# Patient Record
Sex: Female | Born: 1995 | Race: Black or African American | Hispanic: No | Marital: Single | Smoking: Never smoker
Health system: Southern US, Community
[De-identification: ages and names within clinical notes are randomized; demographics above are authoritative.]

## PROBLEM LIST (undated history)

## (undated) ENCOUNTER — Inpatient Hospital Stay (HOSPITAL_COMMUNITY): Payer: Self-pay

## (undated) DIAGNOSIS — E079 Disorder of thyroid, unspecified: Secondary | ICD-10-CM

## (undated) DIAGNOSIS — B999 Unspecified infectious disease: Secondary | ICD-10-CM

## (undated) DIAGNOSIS — E0590 Thyrotoxicosis, unspecified without thyrotoxic crisis or storm: Secondary | ICD-10-CM

## (undated) HISTORY — PX: NO PAST SURGERIES: SHX2092

---
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
## 2014-12-21 ENCOUNTER — Emergency Department (HOSPITAL_COMMUNITY)
Admission: EM | Admit: 2014-12-21 | Discharge: 2014-12-21 | Disposition: A | Discharge status: Home / Self Care | Payer: Medicaid Other | Attending: Emergency Medicine | Admitting: Emergency Medicine

## 2014-12-21 ENCOUNTER — Encounter (HOSPITAL_COMMUNITY): Payer: Self-pay | Admitting: Neurology

## 2014-12-21 DIAGNOSIS — Z760 Encounter for issue of repeat prescription: Secondary | ICD-10-CM | POA: Insufficient documentation

## 2014-12-21 DIAGNOSIS — Z79899 Other long term (current) drug therapy: Secondary | ICD-10-CM | POA: Diagnosis not present

## 2014-12-21 DIAGNOSIS — Z8639 Personal history of other endocrine, nutritional and metabolic disease: Secondary | ICD-10-CM | POA: Diagnosis not present

## 2014-12-21 LAB — TSH: TSH: 0.335 u[IU]/mL — AB (ref 0.350–4.500)

## 2014-12-21 MED ORDER — PROPYLTHIOURACIL 50 MG PO TABS: 50.0000 mg | ORAL_TABLET | Freq: Three times a day (TID) | ORAL | Status: DC

## 2014-12-21 NOTE — ED Notes (Signed)
Pt has no complaints at this time. Pt states she doesn't have a MD at this time.

## 2014-12-21 NOTE — Discharge Instructions (Signed)
Please follow-up with community health and wellness to establish primary care. Take her medications as prescribed. Return to ED for reevaluation. Experience abdominal pain, nausea vomiting or any new or worsening symptoms.  Medicine Refill at the Emergency Department We have refilled your medicine today, but it is best for you to get refills through your primary health care provider's office. In the future, please plan ahead so you do not need to get refills from the emergency department. If the medicine we refilled was a maintenance medicine, you may have received only enough to get you by until you are able to see your regular health care provider.   This information is not intended to replace advice given to you by your health care provider. Make sure you discuss any questions you have with your health care provider.   Document Released: 04/30/2003 Document Revised: 02/01/2014 Document Reviewed: 04/20/2013 Elsevier Interactive Patient Education [employer] Elsevier Inc.

## 2014-12-21 NOTE — ED Notes (Signed)
Pt is in stable condition upon d/c and ambulates from ED. 

## 2014-12-21 NOTE — ED Provider Notes (Signed)
CSN: [id number]     Arrival date & time 12/21/14  1358 History  By signing my name below, I, Taylor Gonzalez, attest that this documentation has been prepared under the direction and in the presence of Mayme Genta, [employer] Electronically Signed: Soijett Gonzalez, ED Scribe. 12/21/2014. 2:36 PM.   Chief Complaint  Patient presents with  . Medication Refill      The history is provided by the patient. No language interpreter was used.    HPI Comments: Taylor Gonzalez is a 19 y.o. female with a medical hx of thyroid dx who presents to the Emergency Department complaining of medication refill onset today. She notes that she has ran out of her PTU that she uses for her hyperthyroid that she was dx with while pregnant. She denies CP, SOB, abdominal pain, sore throat, n/v, constipation, difficulty urinating,  and any other symptoms. Denies having a PCP at this time.    Past Medical History  Diagnosis Date  . Thyroid disease    Past Surgical History  Procedure Laterality Date  . No past surgeries     Family History  Problem Relation Age of Onset  . Hypertension Father   . Diabetes Maternal Grandmother   . Diabetes Maternal Grandfather   . Hypertension Maternal Grandfather   . Diabetes Paternal Grandmother   . Hypertension Paternal Grandmother    Social History  Substance Use Topics  . Smoking status: Never Smoker   . Smokeless tobacco: None  . Alcohol Use: No   OB History    Gravida Para Term Preterm AB TAB SAB Ectopic Multiple Living   Review of Systems  Respiratory: Negative for shortness of breath.   Cardiovascular: Negative for chest pain.  Gastrointestinal: Negative for nausea, vomiting, abdominal pain and constipation.  Genitourinary: Negative for difficulty urinating.  Skin: Negative for color change, rash and wound.  Neurological: Negative for headaches.      Allergies  Review of patient's allergies indicates no known allergies.  Home Medications    Prior to Admission medications   Medication Sig Start Date End Date Taking? Authorizing Provider  Prenatal Vit-Fe Fumarate-FA (PRENATAL MULTIVITAMIN) TABS tablet Take 1 tablet by mouth daily at 12 noon.    Historical Provider, MD  propylthiouracil (PTU) 50 MG tablet Take 1 tablet (50 mg total) by mouth 3 (three) times daily. 12/21/14   Luma Clopper, PA-C   BP 124/75 mmHg  Pulse 110  Temp(Src) 98.6 F (37 C) (Oral)  Resp 18  Ht  (1.575 m)  Wt 73.71 kg  BMI 29.71 kg/m2  SpO2 100%  LMP 12/08/2014 Physical Exam  Constitutional: She is oriented to person, place, and time. She appears well-developed and well-nourished. No distress.  HENT:  Head: Normocephalic and atraumatic.  Eyes: EOM are normal.  Neck: Neck supple. No thyromegaly present.  Cardiovascular: Normal rate, regular rhythm and normal heart sounds.  Exam reveals no gallop and no friction rub.   No murmur heard. No tachycardia on my exam heart rate mid 90s.  Pulmonary/Chest: Effort normal and breath sounds normal. No respiratory distress. She has no wheezes. She has no rales.  Musculoskeletal: Normal range of motion.  Lymphadenopathy:    She has no cervical adenopathy.  Neurological: She is alert and oriented to person, place, and time.  Skin: Skin is warm and dry. She is not diaphoretic.  Psychiatric: She has a normal mood and affect. Her behavior is normal.  Nursing note and vitals reviewed.   ED Course  Procedures (including critical care time) DIAGNOSTIC STUDIES: Oxygen Saturation is 100% on RA, nl by my interpretation.    COORDINATION OF CARE: 2:36 PM Discussed treatment plan with pt at bedside which includes TSH, PTU Rx refill, referral to  and wellness and pt agreed to plan.    Labs Review Labs Reviewed  TSH    Imaging Review No results found. I have personally reviewed and evaluated these lab results as part of my medical decision-making.   EKG Interpretation None     Meds  given in ED:  Medications - No data to display  Discharge Medication List as of 12/21/2014  2:40 PM     Filed Vitals:   12/21/14 1413  BP: 124/75  Pulse: 110  Temp: 98.6 F (37 C)  TempSrc: Oral  Resp: 18  Height: 5\' 2"  (1.575 m)  Weight: 73.71 kg  SpO2: 100%    MDM  Taylor Gonzalez is a 19 y.o. female presents for medication refill. Patient is on propylthiouracil, comes in for medication refill. Was placed on this medication during pregnancy for hyperthyroidism. Will provide short course refill. Drawn TSH. Given referral to the health and wellness Center so she may establish primary care and further management of her hyperthyroidism. Denies any other medical problems at this time. Given strict return precautions. Patient verbalizes understanding. Appears well and is appropriate for discharge.  Final diagnoses:  Medication refill    I personally performed the services described in this documentation, which was scribed in my presence. The recorded information has been reviewed and is accurate.    Joycie PeekBenjamin Hosanna Betley, PA-C 12/21/14 1502  Pricilla LovelessScott Goldston, MD 12/22/14 [phone]

## 2014-12-21 NOTE — ED Notes (Signed)
Pt is here requesting prescription for thyroid medication. She was told during her pregnancy several months ago she had hyperthyroid and was started on medication. She is here today for refill. Pt does not have PCP. Denies having any s/s just here for refill.

## 2016-01-26 NOTE — L&D Delivery Note (Signed)
21 y.o. G2P1001 at 6548w4d admitted for IOL for hyperthyroidism delivered a viable female infant at 0845 in cephalic, LOA position s/p Cytotec, foley bulb, and pitocin for induction. No nuchal cord. Right anterior shoulder delivered after minimal turtling due to mother delaying pushing with the contraction. 15 minute delayed cord clamping per family's request. Cord clamped x2 and cut. Placenta delivered spontaneously intact, with 3VC. Fundus firm on exam with massage and pitocin. Good hemostasis noted.  Anesthesia: Epidural Laceration: N/a Suture: N/a Good hemostasis noted. EBL: 100 cc  Mom and baby recovering in LDR.    Apgars: APGAR (1 MIN): 7   APGAR (5 MINS): 9   Weight: Pending skin to skin  Sponge and instrument count were correct x2. Placenta sent home. Girl, Breast feeding, Unsure for contraception.  [place] Shirley, DO FM Resident PGY-1 08/07/2016 9:16 AM   I confirm that I have verified the information documented in the resident's note and I was present and assisted with delivery.   Raynelle FanningJulie P. Hitesh Fouche, MD OB Fellow

## 2016-06-15 ENCOUNTER — Inpatient Hospital Stay (HOSPITAL_COMMUNITY)
Admission: AD | Admit: 2016-06-15 | Discharge: 2016-06-15 | Disposition: A | Discharge status: Home / Self Care | Payer: Medicaid Other | Source: Ambulatory Visit | Attending: Family Medicine | Admitting: Family Medicine

## 2016-06-15 ENCOUNTER — Encounter (HOSPITAL_COMMUNITY): Payer: Self-pay | Admitting: *Deleted

## 2016-06-15 DIAGNOSIS — Z9119 Patient's noncompliance with other medical treatment and regimen: Secondary | ICD-10-CM | POA: Diagnosis not present

## 2016-06-15 DIAGNOSIS — B9689 Other specified bacterial agents as the cause of diseases classified elsewhere: Secondary | ICD-10-CM

## 2016-06-15 DIAGNOSIS — E0590 Thyrotoxicosis, unspecified without thyrotoxic crisis or storm: Secondary | ICD-10-CM | POA: Diagnosis not present

## 2016-06-15 DIAGNOSIS — O99283 Endocrine, nutritional and metabolic diseases complicating pregnancy, third trimester: Secondary | ICD-10-CM | POA: Diagnosis not present

## 2016-06-15 DIAGNOSIS — Z91199 Patient's noncompliance with other medical treatment and regimen due to unspecified reason: Secondary | ICD-10-CM

## 2016-06-15 DIAGNOSIS — Z3A34 34 weeks gestation of pregnancy: Secondary | ICD-10-CM | POA: Insufficient documentation

## 2016-06-15 DIAGNOSIS — O26893 Other specified pregnancy related conditions, third trimester: Secondary | ICD-10-CM | POA: Insufficient documentation

## 2016-06-15 DIAGNOSIS — N760 Acute vaginitis: Secondary | ICD-10-CM

## 2016-06-15 DIAGNOSIS — R000 Tachycardia, unspecified: Secondary | ICD-10-CM

## 2016-06-15 DIAGNOSIS — Z79899 Other long term (current) drug therapy: Secondary | ICD-10-CM | POA: Diagnosis not present

## 2016-06-15 DIAGNOSIS — O0933 Supervision of pregnancy with insufficient antenatal care, third trimester: Secondary | ICD-10-CM | POA: Diagnosis not present

## 2016-06-15 DIAGNOSIS — R109 Unspecified abdominal pain: Secondary | ICD-10-CM | POA: Insufficient documentation

## 2016-06-15 DIAGNOSIS — O9989 Other specified diseases and conditions complicating pregnancy, childbirth and the puerperium: Secondary | ICD-10-CM

## 2016-06-15 HISTORY — DX: Thyrotoxicosis, unspecified without thyrotoxic crisis or storm: E05.90

## 2016-06-15 LAB — RAPID URINE DRUG SCREEN, HOSP PERFORMED
Amphetamines: NOT DETECTED
Barbiturates: NOT DETECTED
Benzodiazepines: NOT DETECTED
Cocaine: NOT DETECTED
OPIATES: NOT DETECTED
TETRAHYDROCANNABINOL: NOT DETECTED

## 2016-06-15 LAB — URINALYSIS, ROUTINE W REFLEX MICROSCOPIC
BILIRUBIN URINE: NEGATIVE
Glucose, UA: NEGATIVE mg/dL
Ketones, ur: NEGATIVE mg/dL
NITRITE: NEGATIVE
Protein, ur: NEGATIVE mg/dL
SPECIFIC GRAVITY, URINE: 1.002 — AB (ref 1.005–1.030)
pH: 7 (ref 5.0–8.0)

## 2016-06-15 LAB — WET PREP, GENITAL
SPERM: NONE SEEN
Trich, Wet Prep: NONE SEEN
Yeast Wet Prep HPF POC: NONE SEEN

## 2016-06-15 LAB — CBC
HCT: 30.8 % — ABNORMAL LOW (ref 36.0–46.0)
HEMOGLOBIN: 9.8 g/dL — AB (ref 12.0–15.0)
MCH: 26.3 pg (ref 26.0–34.0)
MCHC: 31.8 g/dL (ref 30.0–36.0)
MCV: 82.8 fL (ref 78.0–100.0)
Platelets: 310 10*3/uL (ref 150–400)
RBC: 3.72 MIL/uL — AB (ref 3.87–5.11)
RDW: 14.4 % (ref 11.5–15.5)
WBC: 8.2 10*3/uL (ref 4.0–10.5)

## 2016-06-15 LAB — DIFFERENTIAL
BASOS ABS: 0 10*3/uL (ref 0.0–0.1)
Basophils Relative: 0 %
EOS PCT: 1 %
Eosinophils Absolute: 0.1 10*3/uL (ref 0.0–0.7)
LYMPHS ABS: 1.8 10*3/uL (ref 0.7–4.0)
Lymphocytes Relative: 22 %
MONO ABS: 0.5 10*3/uL (ref 0.1–1.0)
Monocytes Relative: 7 %
NEUTROS ABS: 5.8 10*3/uL (ref 1.7–7.7)
NEUTROS PCT: 70 %

## 2016-06-15 LAB — OB RESULTS CONSOLE GBS: STREP GROUP B AG: POSITIVE

## 2016-06-15 LAB — HEPATITIS B SURFACE ANTIGEN: HEP B S AG: NEGATIVE

## 2016-06-15 LAB — TSH: TSH: 0.025 u[IU]/mL — ABNORMAL LOW (ref 0.350–4.500)

## 2016-06-15 LAB — T4, FREE: FREE T4: 0.74 ng/dL (ref 0.61–1.12)

## 2016-06-15 MED ORDER — NIFEDIPINE 10 MG PO CAPS
10.0000 mg | ORAL_CAPSULE | Freq: Once | ORAL | Status: AC
  Administered 2016-06-15: 10 mg via ORAL
  Filled 2016-06-15: qty 1

## 2016-06-15 MED ORDER — METRONIDAZOLE 500 MG PO TABS: 500.0000 mg | ORAL_TABLET | Freq: Two times a day (BID) | ORAL | 0 refills | Status: DC

## 2016-06-15 MED ORDER — METOPROLOL SUCCINATE ER 25 MG PO TB24: 25.0000 mg | ORAL_TABLET | Freq: Once | ORAL | Status: DC

## 2016-06-15 MED ORDER — METOPROLOL SUCCINATE ER 25 MG PO TB24: 25.0000 mg | ORAL_TABLET | Freq: Every day | ORAL | 0 refills | Status: DC

## 2016-06-15 MED ORDER — LACTATED RINGERS IV SOLN
INTRAVENOUS | Status: DC
  Administered 2016-06-15: 15:00:00 via INTRAVENOUS

## 2016-06-15 NOTE — MAU Provider Note (Signed)
Chief Complaint:  Abdominal Pain   First Provider Initiated Contact with Patient 06/15/16 1328     HPI: Taylor Gonzalez is a 21 y.o. G2P1001 at 2w2dwho presents to maternity admissions reporting low to mid abdominal pain that comes and goes.  Not severe.  Has not had much prenatal care.  Micah Flesher a couple of times to [employer] in another county.. She reports good fetal movement, denies LOF, vaginal bleeding, vaginal itching/burning, urinary symptoms, h/a, dizziness, n/v, diarrhea, constipation or fever/chills.    Noted to be tachycardic,   When questioned, states she has hyperthyroidism but has not taken her meds in a few years.  Does not see a doctor for this.  Was diagnosed last pregnancy.   Abdominal Pain  This is a new problem. The current episode started today. The onset quality is gradual. The problem occurs intermittently. The problem has been unchanged. The pain is located in the periumbilical region and suprapubic region. The pain is mild. The quality of the pain is cramping. The abdominal pain does not radiate. Pertinent negatives include no constipation, diarrhea, dysuria, fever, headaches, myalgias, nausea or vomiting. Nothing aggravates the pain. The pain is relieved by nothing. She has tried nothing for the symptoms.     RN Note: Having some stomach pain, started this morning. Comes and goes. Did go to HD a few times, hasn't been in some months   Past Medical History: Past Medical History:  Diagnosis Date  . Hyperthyroidism   . Thyroid disease     Past obstetric history: OB History  Gravida Para Term Preterm AB Living  2 1 1     1   SAB TAB Ectopic Multiple Live Births          1    # Outcome Date GA Lbr Len/2nd Weight Sex Delivery Anes PTL Lv  2 Current           1 Term 07/12/13 [redacted]w[redacted]d 28:50 / 01:12 5 lb 9 oz (2.523 kg) F Vag-Vacuum EPI  LIV      Past Surgical History: Past Surgical History:  Procedure Laterality Date  . NO PAST SURGERIES      Family  History: Family History  Problem Relation Age of Onset  . Diabetes Paternal Grandmother   . Hypertension Paternal Grandmother   . Hypertension Father   . Diabetes Maternal Grandmother   . Diabetes Maternal Grandfather   . Hypertension Maternal Grandfather     Social History: Social History  Substance Use Topics  . Smoking status: Never Smoker  . Smokeless tobacco: Never Used  . Alcohol use No    Allergies: No Known Allergies  Meds:  Prescriptions Prior to Admission  Medication Sig Dispense Refill Last Dose  . Prenatal Vit-Fe Fumarate-FA (PRENATAL MULTIVITAMIN) TABS tablet Take 1 tablet by mouth daily at 12 noon.   07/11/2013 at Unknown time  . propylthiouracil (PTU) 50 MG tablet Take 1 tablet (50 mg total) by mouth 3 (three) times daily. 30 tablet 0     I have reviewed patient's Past Medical Hx, Surgical Hx, Family Hx, Social Hx, medications and allergies.   ROS:  Review of Systems  Constitutional: Negative for fever.  Gastrointestinal: Positive for abdominal pain. Negative for constipation, diarrhea, nausea and vomiting.  Genitourinary: Negative for dysuria.  Musculoskeletal: Negative for myalgias.  Neurological: Negative for headaches.   Other systems negative  Physical Exam  Patient Vitals for the past 24 hrs:  BP Temp Temp src Pulse Resp SpO2 Weight  06/15/16 1302 125/76  99.3 F (37.4 C) Oral (!) 122 18 100 % 175 lb 8 oz (79.6 kg)   Constitutional: Well-developed, well-nourished female in no acute distress.  Cardiovascular: rapid rate and regular rhythm Respiratory: normal effort, clear to auscultation bilaterally GI: Abd soft, non-tender, gravid appropriate for gestational age.   No rebound or guarding. Fundus is at about 35cm MS: Extremities nontender, no edema, normal ROM Neurologic: Alert and oriented x 4.  GU: Neg CVAT.  PELVIC EXAM: Cervix pink, visually closed, without lesion, scant white creamy discharge, vaginal walls and external genitalia normal    Dilation: Closed Effacement (%): 80 Exam by:: Williams,CNM   FHT:  Baseline 130 , moderate variability, accelerations present, no decelerations Contractions:  Irritability with small contractions   Labs:    Results for orders placed or performed during the hospital encounter of 06/15/16 (from the past 72 hour(s))  Urinalysis, Routine w reflex microscopic     Status: Abnormal   Collection Time: 06/15/16  1:03 PM  Result Value Ref Range   Color, Urine YELLOW YELLOW   APPearance HAZY (A) CLEAR   Specific Gravity, Urine 1.002 (L) 1.005 - 1.030   pH 7.0 5.0 - 8.0   Glucose, UA NEGATIVE NEGATIVE mg/dL   Hgb urine dipstick MODERATE (A) NEGATIVE   Bilirubin Urine NEGATIVE NEGATIVE   Ketones, ur NEGATIVE NEGATIVE mg/dL   Protein, ur NEGATIVE NEGATIVE mg/dL   Nitrite NEGATIVE NEGATIVE   Leukocytes, UA LARGE (A) NEGATIVE   RBC / HPF TOO NUMEROUS TO COUNT 0 - 5 RBC/hpf   WBC, UA 6-30 0 - 5 WBC/hpf   Bacteria, UA RARE (A) NONE SEEN   Squamous Epithelial / LPF 6-30 (A) NONE SEEN  Rapid urine drug screen (hospital performed)     Status: None   Collection Time: 06/15/16  1:03 PM  Result Value Ref Range   Opiates NONE DETECTED NONE DETECTED   Cocaine NONE DETECTED NONE DETECTED   Benzodiazepines NONE DETECTED NONE DETECTED   Amphetamines NONE DETECTED NONE DETECTED   Tetrahydrocannabinol NONE DETECTED NONE DETECTED   Barbiturates NONE DETECTED NONE DETECTED    Comment:        DRUG SCREEN FOR MEDICAL PURPOSES ONLY.  IF CONFIRMATION IS NEEDED FOR ANY PURPOSE, NOTIFY LAB WITHIN 5 DAYS.        LOWEST DETECTABLE LIMITS FOR URINE DRUG SCREEN Drug Class       Cutoff (ng/mL) Amphetamine      1000 Barbiturate      200 Benzodiazepine   200 Tricyclics       300 Opiates          300 Cocaine          300 THC              50   CBC     Status: Abnormal   Collection Time: 06/15/16  1:59 PM  Result Value Ref Range   WBC 8.2 4.0 - 10.5 K/uL   RBC 3.72 (L) 3.87 - 5.11 MIL/uL   Hemoglobin 9.8  (L) 12.0 - 15.0 g/dL   HCT 16.1 (L) 09.6 - 04.5 %   MCV 82.8 78.0 - 100.0 fL   MCH 26.3 26.0 - 34.0 pg   MCHC 31.8 30.0 - 36.0 g/dL   RDW 40.9 81.1 - 91.4 %   Platelets 310 150 - 400 K/uL  Differential     Status: None   Collection Time: 06/15/16  1:59 PM  Result Value Ref Range   Neutrophils Relative % 70 %  Neutro Abs 5.8 1.7 - 7.7 K/uL   Lymphocytes Relative 22 %   Lymphs Abs 1.8 0.7 - 4.0 K/uL   Monocytes Relative 7 %   Monocytes Absolute 0.5 0.1 - 1.0 K/uL   Eosinophils Relative 1 %   Eosinophils Absolute 0.1 0.0 - 0.7 K/uL   Basophils Relative 0 %   Basophils Absolute 0.0 0.0 - 0.1 K/uL  Wet prep, genital     Status: Abnormal   Collection Time: 06/15/16  2:10 PM  Result Value Ref Range   Yeast Wet Prep HPF POC NONE SEEN NONE SEEN   Trich, Wet Prep NONE SEEN NONE SEEN   Clue Cells Wet Prep HPF POC PRESENT (A) NONE SEEN    Comment: CORRECTED RESULTS CALLED TO: Micah NoelWESTEN,L.ON [id number] AT 1454 BY PATEL,S. CORRECTED ON 05/22 AT 1451: PREVIOUSLY REPORTED AS NONE SEEN    WBC, Wet Prep HPF POC MANY (A) NONE SEEN    Comment: MANY BACTERIA SEEN CORRECTED RESULTS CALLED TO: Micah NoelWESTEN,L.ON [id number] AT 1454 BY PATEL,S. CORRECTED ON 05/22 AT 1451: PREVIOUSLY REPORTED AS NONE SEEN MANY BACTERIA SEEN    Sperm NONE SEEN   TSH     Status: Abnormal   Collection Time: 06/15/16  2:46 PM  Result Value Ref Range   TSH 0.025 (L) 0.350 - 4.500 uIU/mL    Comment: Performed by a 3rd Generation assay with a functional sensitivity of <=0.01 uIU/mL.  T4, free     Status: None   Collection Time: 06/15/16  2:46 PM  Result Value Ref Range   Free T4 0.74 0.61 - 1.12 ng/dL    Comment: (NOTE) Biotin ingestion may interfere with free T4 tests. If the results are inconsistent with the TSH level, previous test results, or the clinical presentation, then consider biotin interference. If needed, order repeat testing after stopping biotin. Performed at [facility], [address] [place] [id number]      Imaging:  No results found.  MAU Course/MDM: I have ordered labs and reviewed results. Thyroid panel ordered to see where she is at withthis.  TSH is low (resulted after pt left). NST reviewed and found to be reactive. Heart rate varied from 110-140.   Consult Dr Adrian BlackwaterStinson with presentation, exam findings and test results.  EKG ordered > sinus tachycardia (read by Dr Adrian BlackwaterStinson) Treatments in MAU included IV hydration, Nifedipine for preterm irritability.  This improved with medication.   Her heart rate diminished over time to 110s.  .  We collected cultures, UA/c&s, and UDS (neg) Will start her on Toprol XL for control of tachycardia to prevent failure until we can correct her hyperthyroidism.  Assessment: Single IUP at 6328w2d  Preterm uterine irritability, resolved Preterm cervical effacement. Limited prenatal care Tachycardia likely related to uncontrolled hyperthyroidism Noncompliance  Plan: Discharge home Preterm Labor precautions and fetal kick counts Follow up in Office for prenatal visits and recheck Outpatient anatomy US ordered Messages sent to get her in for New OB and to refer to Endocrinology  Encouraged to return here or to other Urgent Care/ED if she develops worsening of symptoms, increase in pain, fever, or other concerning symptoms.   Pt stable at time of discharge.  Wynelle BourgeoisMarie Williams CNM, MSN Certified Nurse-Midwife 06/15/2016 1:28 PM

## 2016-06-15 NOTE — MAU Note (Signed)
Having some stomach pain, started this morning. Comes and goes. Did go to HD a few times, hasn't been in some months

## 2016-06-15 NOTE — Discharge Instructions (Signed)
Hyperthyroidism Hyperthyroidism is when the thyroid is too active (overactive). Your thyroid is a large gland that is located in your neck. The thyroid helps to control how your body uses food (metabolism). When your thyroid is overactive, it produces too much of a hormone called thyroxine. What are the causes? Causes of hyperthyroidism may include:  Graves disease. This is when your immune system attacks the thyroid gland. This is the most common cause.  Inflammation of the thyroid gland.  Tumor in the thyroid gland or somewhere else.  Excessive use of thyroid medicines, including:  Prescription thyroid supplement.  Herbal supplements that mimic thyroid hormones.  Solid or fluid-filled lumps within your thyroid gland (thyroid nodules).  Excessive ingestion of iodine. What increases the risk?  Being female.  Having a family history of thyroid conditions. What are the signs or symptoms? Signs and symptoms of hyperthyroidism may include:  Nervousness.  Inability to tolerate heat.  Unexplained weight loss.  Diarrhea.  Change in the texture of hair or skin.  Heart skipping beats or making extra beats.  Rapid heart rate.  Loss of menstruation.  Shaky hands.  Fatigue.  Restlessness.  Increased appetite.  Sleep problems.  Enlarged thyroid gland or nodules. How is this diagnosed? Diagnosis of hyperthyroidism may include:  Medical history and physical exam.  Blood tests.  Ultrasound tests. How is this treated? Treatment may include:  Medicines to control your thyroid.  Surgery to remove your thyroid.  Radiation therapy. Follow these instructions at home:  Take medicines only as directed by your health care provider.  Do not use any tobacco products, including cigarettes, chewing tobacco, or electronic cigarettes. If you need help quitting, ask your health care provider.  Do not exercise or do physical activity until your health care provider  approves.  Keep all follow-up appointments as directed by your health care provider. This is important. Contact a health care provider if:  Your symptoms do not get better with treatment.  You have fever.  You are taking thyroid replacement medicine and you:  Have depression.  Feel mentally and physically slow.  Have weight gain. Get help right away if:  You have decreased alertness or a change in your awareness.  You have abdominal pain.  You feel dizzy.  You have a rapid heartbeat.  You have an irregular heartbeat. This information is not intended to replace advice given to you by your health care provider. Make sure you discuss any questions you have with your health care provider. Document Released: 01/11/2005 Document Revised: 06/12/2015 Document Reviewed: 05/29/2013 Elsevier Interactive Patient Education  2017 [employer]. Preterm Labor and Birth Information The normal length of a pregnancy is 39-41 weeks. Preterm labor is when labor starts before 37 completed weeks of pregnancy. What are the risk factors for preterm labor? Preterm labor is more likely to occur in women who:  Have certain infections during pregnancy such as a bladder infection, sexually transmitted infection, or infection inside the uterus (chorioamnionitis).  Have a shorter-than-normal cervix.  Have gone into preterm labor before.  Have had surgery on their cervix.  Are younger than age 35 or older than age 85.  Are African American.  Are pregnant with twins or multiple babies (multiple gestation).  Take street drugs or smoke while pregnant.  Do not gain enough weight while pregnant.  Became pregnant shortly after having been pregnant. What are the symptoms of preterm labor? Symptoms of preterm labor include:  Cramps similar to those that can happen during a  menstrual period. The cramps may happen with diarrhea.  Pain in the abdomen or lower back.  Regular uterine contractions  that may feel like tightening of the abdomen.  A feeling of increased pressure in the pelvis.  Increased watery or bloody mucus discharge from the vagina.  Water breaking (ruptured amniotic sac). Why is it important to recognize signs of preterm labor? It is important to recognize signs of preterm labor because babies who are born prematurely may not be fully developed. This can put them at an increased risk for:  Long-term (chronic) heart and lung problems.  Difficulty immediately after birth with regulating body systems, including blood sugar, body temperature, heart rate, and breathing rate.  Bleeding in the brain.  Cerebral palsy.  Learning difficulties.  Death. These risks are highest for babies who are born before 34 weeks of pregnancy. How is preterm labor treated? Treatment depends on the length of your pregnancy, your condition, and the health of your baby. It may involve:  Having a stitch (suture) placed in your cervix to prevent your cervix from opening too early (cerclage).  Taking or being given medicines, such as:  Hormone medicines. These may be given early in pregnancy to help support the pregnancy.  Medicine to stop contractions.  Medicines to help mature the babys lungs. These may be prescribed if the risk of delivery is high.  Medicines to prevent your baby from developing cerebral palsy. If the labor happens before 34 weeks of pregnancy, you may need to stay in the hospital. What should I do if I think I am in preterm labor? If you think that you are going into preterm labor, call your health care provider right away. How can I prevent preterm labor in future pregnancies? To increase your chance of having a full-term pregnancy:  Do not use any tobacco products, such as cigarettes, chewing tobacco, and e-cigarettes. If you need help quitting, ask your health care provider.  Do not use street drugs or medicines that have not been prescribed to you during  your pregnancy.  Talk with your health care provider before taking any herbal supplements, even if you have been taking them regularly.  Make sure you gain a healthy amount of weight during your pregnancy.  Watch for infection. If you think that you might have an infection, get it checked right away.  Make sure to tell your health care provider if you have gone into preterm labor before. This information is not intended to replace advice given to you by your health care provider. Make sure you discuss any questions you have with your health care provider. Document Released: 04/03/2003 Document Revised: 06/24/2015 Document Reviewed: 06/04/2015 Elsevier Interactive Patient Education  2017 [employer]

## 2016-06-16 ENCOUNTER — Encounter: Payer: Self-pay | Admitting: Advanced Practice Midwife

## 2016-06-16 ENCOUNTER — Encounter: Payer: Medicaid Other | Admitting: Advanced Practice Midwife

## 2016-06-16 LAB — CULTURE, BETA STREP (GROUP B ONLY)

## 2016-06-16 LAB — RPR: RPR Ser Ql: NONREACTIVE

## 2016-06-16 LAB — T3 UPTAKE: T3 Uptake Ratio: 12 % — ABNORMAL LOW (ref 24–39)

## 2016-06-16 LAB — HIV ANTIBODY (ROUTINE TESTING W REFLEX): HIV Screen 4th Generation wRfx: NONREACTIVE

## 2016-06-16 LAB — CULTURE, OB URINE: Culture: 70000 — AB

## 2016-06-16 LAB — T3, FREE: T3 FREE: 3.3 pg/mL (ref 2.0–4.4)

## 2016-06-16 LAB — RUBELLA SCREEN: Rubella: 2.41 index (ref >0.99)

## 2016-06-16 LAB — GC/CHLAMYDIA PROBE AMP (~~LOC~~) NOT AT ARMC
CHLAMYDIA, DNA PROBE: NEGATIVE
NEISSERIA GONORRHEA: NEGATIVE

## 2016-06-17 ENCOUNTER — Telehealth: Payer: Self-pay | Admitting: Certified Nurse Midwife

## 2016-06-17 MED ORDER — CEPHALEXIN 500 MG PO CAPS: 500.0000 mg | ORAL_CAPSULE | Freq: Four times a day (QID) | ORAL | 0 refills | Status: DC

## 2016-06-17 NOTE — Telephone Encounter (Signed)
GBS in urine, needs Keflex. LMTCB.

## 2016-06-17 NOTE — Telephone Encounter (Signed)
GBS UTI. Pt notified. Rx sent to pharmacy.

## 2016-06-23 ENCOUNTER — Ambulatory Visit (INDEPENDENT_AMBULATORY_CARE_PROVIDER_SITE_OTHER): Payer: Self-pay | Admitting: Family Medicine

## 2016-06-23 ENCOUNTER — Encounter (HOSPITAL_COMMUNITY): Payer: Self-pay | Admitting: Advanced Practice Midwife

## 2016-06-23 ENCOUNTER — Encounter: Payer: Self-pay | Admitting: Family Medicine

## 2016-06-23 VITALS — BP 124/67 | HR 98 | Wt 174.0 lb

## 2016-06-23 DIAGNOSIS — E0590 Thyrotoxicosis, unspecified without thyrotoxic crisis or storm: Secondary | ICD-10-CM | POA: Insufficient documentation

## 2016-06-23 DIAGNOSIS — R8271 Bacteriuria: Secondary | ICD-10-CM

## 2016-06-23 DIAGNOSIS — O99283 Endocrine, nutritional and metabolic diseases complicating pregnancy, third trimester: Secondary | ICD-10-CM

## 2016-06-23 DIAGNOSIS — O0990 Supervision of high risk pregnancy, unspecified, unspecified trimester: Secondary | ICD-10-CM

## 2016-06-23 DIAGNOSIS — O0993 Supervision of high risk pregnancy, unspecified, third trimester: Secondary | ICD-10-CM

## 2016-06-23 DIAGNOSIS — O99280 Endocrine, nutritional and metabolic diseases complicating pregnancy, unspecified trimester: Secondary | ICD-10-CM

## 2016-06-23 LAB — POCT URINALYSIS DIP (DEVICE)
BILIRUBIN URINE: NEGATIVE
Glucose, UA: NEGATIVE mg/dL
Hgb urine dipstick: NEGATIVE
Ketones, ur: 15 mg/dL — AB
NITRITE: NEGATIVE
PH: 6.5 (ref 5.0–8.0)
Protein, ur: NEGATIVE mg/dL
Specific Gravity, Urine: 1.015 (ref 1.005–1.030)
UROBILINOGEN UA: 0.2 mg/dL (ref 0.0–1.0)

## 2016-06-23 NOTE — Patient Instructions (Signed)
 Third Trimester of Pregnancy The third trimester is from week 28 through week 40 (months 7 through 9). The third trimester is a time when the unborn baby (fetus) is growing rapidly. At the end of the ninth month, the fetus is about 20 inches in length and weighs 6-10 pounds. Body changes during your third trimester Your body will continue to go through many changes during pregnancy. The changes vary from woman to woman. During the third trimester:  Your weight will continue to increase. You can expect to gain 25-35 pounds (11-16 kg) by the end of the pregnancy.  You may begin to get stretch marks on your hips, abdomen, and breasts.  You may urinate more often because the fetus is moving lower into your pelvis and pressing on your bladder.  You may develop or continue to have heartburn. This is caused by increased hormones that slow down muscles in the digestive tract.  You may develop or continue to have constipation because increased hormones slow digestion and cause the muscles that push waste through your intestines to relax.  You may develop hemorrhoids. These are swollen veins (varicose veins) in the rectum that can itch or be painful.  You may develop swollen, bulging veins (varicose veins) in your legs.  You may have increased body aches in the pelvis, back, or thighs. This is due to weight gain and increased hormones that are relaxing your joints.  You may have changes in your hair. These can include thickening of your hair, rapid growth, and changes in texture. Some women also have hair loss during or after pregnancy, or hair that feels dry or thin. Your hair will most likely return to normal after your baby is born.  Your breasts will continue to grow and they will continue to become tender. A yellow fluid (colostrum) may leak from your breasts. This is the first milk you are producing for your baby.  Your belly button may stick out.  You may notice more swelling in your  hands, face, or ankles.  You may have increased tingling or numbness in your hands, arms, and legs. The skin on your belly may also feel numb.  You may feel short of breath because of your expanding uterus.  You may have more problems sleeping. This can be caused by the size of your belly, increased need to urinate, and an increase in your body's metabolism.  You may notice the fetus "dropping," or moving lower in your abdomen (lightening).  You may have increased vaginal discharge.  You may notice your joints feel loose and you may have pain around your pelvic bone.  What to expect at prenatal visits You will have prenatal exams every 2 weeks until week 36. Then you will have weekly prenatal exams. During a routine prenatal visit:  You will be weighed to make sure you and the baby are growing normally.  Your blood pressure will be taken.  Your abdomen will be measured to track your baby's growth.  The fetal heartbeat will be listened to.  Any test results from the previous visit will be discussed.  You may have a cervical check near your due date to see if your cervix has softened or thinned (effaced).  You will be tested for Group B streptococcus. This happens between 35 and 37 weeks.  Your health care provider may ask you:  What your birth plan is.  How you are feeling.  If you are feeling the baby move.  If you have   had any abnormal symptoms, such as leaking fluid, bleeding, severe headaches, or abdominal cramping.  If you are using any tobacco products, including cigarettes, chewing tobacco, and electronic cigarettes.  If you have any questions.  Other tests or screenings that may be performed during your third trimester include:  Blood tests that check for low iron levels (anemia).  Fetal testing to check the health, activity level, and growth of the fetus. Testing is done if you have certain medical conditions or if there are problems during the  pregnancy.  Nonstress test (NST). This test checks the health of your baby to make sure there are no signs of problems, such as the baby not getting enough oxygen. During this test, a belt is placed around your belly. The baby is made to move, and its heart rate is monitored during movement.  What is false labor? False labor is a condition in which you feel small, irregular tightenings of the muscles in the womb (contractions) that usually go away with rest, changing position, or drinking water. These are called Braxton Hicks contractions. Contractions may last for hours, days, or even weeks before true labor sets in. If contractions come at regular intervals, become more frequent, increase in intensity, or become painful, you should see your health care provider. What are the signs of labor?  Abdominal cramps.  Regular contractions that start at 10 minutes apart and become stronger and more frequent with time.  Contractions that start on the top of the uterus and spread down to the lower abdomen and back.  Increased pelvic pressure and dull back pain.  A watery or bloody mucus discharge that comes from the vagina.  Leaking of amniotic fluid. This is also known as your "water breaking." It could be a slow trickle or a gush. Let your health care provider know if it has a color or strange odor. If you have any of these signs, call your health care provider right away, even if it is before your due date. Follow these instructions at home: Medicines  Follow your health care provider's instructions regarding medicine use. Specific medicines may be either safe or unsafe to take during pregnancy.  Take a prenatal vitamin that contains at least 600 micrograms (mcg) of folic acid.  If you develop constipation, try taking a stool softener if your health care provider approves. Eating and drinking  Eat a balanced diet that includes fresh fruits and vegetables, whole grains, good sources of protein  such as meat, eggs, or tofu, and low-fat dairy. Your health care provider will help you determine the amount of weight gain that is right for you.  Avoid raw meat and uncooked cheese. These carry germs that can cause birth defects in the baby.  If you have low calcium intake from food, talk to your health care provider about whether you should take a daily calcium supplement.  Eat four or five small meals rather than three large meals a day.  Limit foods that are high in fat and processed sugars, such as fried and sweet foods.  To prevent constipation: ? Drink enough fluid to keep your urine clear or pale yellow. ? Eat foods that are high in fiber, such as fresh fruits and vegetables, whole grains, and beans. Activity  Exercise only as directed by your health care provider. Most women can continue their usual exercise routine during pregnancy. Try to exercise for 30 minutes at least 5 days a week. Stop exercising if you experience uterine contractions.  Avoid   heavy lifting.  Do not exercise in extreme heat or humidity, or at high altitudes.  Wear low-heel, comfortable shoes.  Practice good posture.  You may continue to have sex unless your health care provider tells you otherwise. Relieving pain and discomfort  Take frequent breaks and rest with your legs elevated if you have leg cramps or low back pain.  Take warm sitz baths to soothe any pain or discomfort caused by hemorrhoids. Use hemorrhoid cream if your health care provider approves.  Wear a good support bra to prevent discomfort from breast tenderness.  If you develop varicose veins: ? Wear support pantyhose or compression stockings as told by your healthcare provider. ? Elevate your feet for 15 minutes, 3-4 times a day. Prenatal care  Write down your questions. Take them to your prenatal visits.  Keep all your prenatal visits as told by your health care provider. This is important. Safety  Wear your seat belt at  all times when driving.  Make a list of emergency phone numbers, including numbers for family, friends, the hospital, and police and fire departments. General instructions  Avoid cat litter boxes and soil used by cats. These carry germs that can cause birth defects in the baby. If you have a cat, ask someone to clean the litter box for you.  Do not travel far distances unless it is absolutely necessary and only with the approval of your health care provider.  Do not use hot tubs, steam rooms, or saunas.  Do not drink alcohol.  Do not use any products that contain nicotine or tobacco, such as cigarettes and e-cigarettes. If you need help quitting, ask your health care provider.  Do not use any medicinal herbs or unprescribed drugs. These chemicals affect the formation and growth of the baby.  Do not douche or use tampons or scented sanitary pads.  Do not cross your legs for long periods of time.  To prepare for the arrival of your baby: ? Take prenatal classes to understand, practice, and ask questions about labor and delivery. ? Make a trial run to the hospital. ? Visit the hospital and tour the maternity area. ? Arrange for maternity or paternity leave through employers. ? Arrange for family and friends to take care of pets while you are in the hospital. ? Purchase a rear-facing car seat and make sure you know how to install it in your car. ? Pack your hospital bag. ? Prepare the baby's nursery. Make sure to remove all pillows and stuffed animals from the baby's crib to prevent suffocation.  Visit your dentist if you have not gone during your pregnancy. Use a soft toothbrush to brush your teeth and be gentle when you floss. Contact a health care provider if:  You are unsure if you are in labor or if your water has broken.  You become dizzy.  You have mild pelvic cramps, pelvic pressure, or nagging pain in your abdominal area.  You have lower back pain.  You have persistent  nausea, vomiting, or diarrhea.  You have an unusual or bad smelling vaginal discharge.  You have pain when you urinate. Get help right away if:  Your water breaks before 37 weeks.  You have regular contractions less than 5 minutes apart before 37 weeks.  You have a fever.  You are leaking fluid from your vagina.  You have spotting or bleeding from your vagina.  You have severe abdominal pain or cramping.  You have rapid weight loss or weight   gain.  You have shortness of breath with chest pain.  You notice sudden or extreme swelling of your face, hands, ankles, feet, or legs.  Your baby makes fewer than 10 movements in 2 hours.  You have severe headaches that do not go away when you take medicine.  You have vision changes. Summary  The third trimester is from week 28 through week 40, months 7 through 9. The third trimester is a time when the unborn baby (fetus) is growing rapidly.  During the third trimester, your discomfort may increase as you and your baby continue to gain weight. You may have abdominal, leg, and back pain, sleeping problems, and an increased need to urinate.  During the third trimester your breasts will keep growing and they will continue to become tender. A yellow fluid (colostrum) may leak from your breasts. This is the first milk you are producing for your baby.  False labor is a condition in which you feel small, irregular tightenings of the muscles in the womb (contractions) that eventually go away. These are called Braxton Hicks contractions. Contractions may last for hours, days, or even weeks before true labor sets in.  Signs of labor can include: abdominal cramps; regular contractions that start at 10 minutes apart and become stronger and more frequent with time; watery or bloody mucus discharge that comes from the vagina; increased pelvic pressure and dull back pain; and leaking of amniotic fluid. This information is not intended to replace advice  given to you by your health care provider. Make sure you discuss any questions you have with your health care provider. Document Released: 01/05/2001 Document Revised: 06/19/2015 Document Reviewed: 03/14/2012 [employer] Interactive Patient Education  2017 [employer]   Breastfeeding Deciding to breastfeed is one of the best choices you can make for you and your baby. A change in hormones during pregnancy causes your breast tissue to grow and increases the number and size of your milk ducts. These hormones also allow proteins, sugars, and fats from your blood supply to make breast milk in your milk-producing glands. Hormones prevent breast milk from being released before your baby is born as well as prompt milk flow after birth. Once breastfeeding has begun, thoughts of your baby, as well as his or her sucking or crying, can stimulate the release of milk from your milk-producing glands. Benefits of breastfeeding For Your Baby  Your first milk (colostrum) helps your baby's digestive system function better.  There are antibodies in your milk that help your baby fight off infections.  Your baby has a lower incidence of asthma, allergies, and sudden infant death syndrome.  The nutrients in breast milk are better for your baby than infant formulas and are designed uniquely for your baby's needs.  Breast milk improves your baby's brain development.  Your baby is less likely to develop other conditions, such as childhood obesity, asthma, or type 2 diabetes mellitus.  For You  Breastfeeding helps to create a very special bond between you and your baby.  Breastfeeding is convenient. Breast milk is always available at the correct temperature and costs nothing.  Breastfeeding helps to burn calories and helps you lose the weight gained during pregnancy.  Breastfeeding makes your uterus contract to its prepregnancy size faster and slows bleeding (lochia) after you give birth.  Breastfeeding helps  to lower your risk of developing type 2 diabetes mellitus, osteoporosis, and breast or ovarian cancer later in life.  Signs that your baby is hungry Early Signs of Hunger    Increased alertness or activity.  Stretching.  Movement of the head from side to side.  Movement of the head and opening of the mouth when the corner of the mouth or cheek is stroked (rooting).  Increased sucking sounds, smacking lips, cooing, sighing, or squeaking.  Hand-to-mouth movements.  Increased sucking of fingers or hands.  Late Signs of Hunger  Fussing.  Intermittent crying.  Extreme Signs of Hunger Signs of extreme hunger will require calming and consoling before your baby will be able to breastfeed successfully. Do not wait for the following signs of extreme hunger to occur before you initiate breastfeeding:  Restlessness.  A loud, strong cry.  Screaming.  Breastfeeding basics Breastfeeding Initiation  Find a comfortable place to sit or lie down, with your neck and back well supported.  Place a pillow or rolled up blanket under your baby to bring him or her to the level of your breast (if you are seated). Nursing pillows are specially designed to help support your arms and your baby while you breastfeed.  Make sure that your baby's abdomen is facing your abdomen.  Gently massage your breast. With your fingertips, massage from your chest wall toward your nipple in a circular motion. This encourages milk flow. You may need to continue this action during the feeding if your milk flows slowly.  Support your breast with 4 fingers underneath and your thumb above your nipple. Make sure your fingers are well away from your nipple and your baby's mouth.  Stroke your baby's lips gently with your finger or nipple.  When your baby's mouth is open wide enough, quickly bring your baby to your breast, placing your entire nipple and as much of the colored area around your nipple (areola) as possible into  your baby's mouth. ? More areola should be visible above your baby's upper lip than below the lower lip. ? Your baby's tongue should be between his or her lower gum and your breast.  Ensure that your baby's mouth is correctly positioned around your nipple (latched). Your baby's lips should create a seal on your breast and be turned out (everted).  It is common for your baby to suck about 2-3 minutes in order to start the flow of breast milk.  Latching Teaching your baby how to latch on to your breast properly is very important. An improper latch can cause nipple pain and decreased milk supply for you and poor weight gain in your baby. Also, if your baby is not latched onto your nipple properly, he or she may swallow some air during feeding. This can make your baby fussy. Burping your baby when you switch breasts during the feeding can help to get rid of the air. However, teaching your baby to latch on properly is still the best way to prevent fussiness from swallowing air while breastfeeding. Signs that your baby has successfully latched on to your nipple:  Silent tugging or silent sucking, without causing you pain.  Swallowing heard between every 3-4 sucks.  Muscle movement above and in front of his or her ears while sucking.  Signs that your baby has not successfully latched on to nipple:  Sucking sounds or smacking sounds from your baby while breastfeeding.  Nipple pain.  If you think your baby has not latched on correctly, slip your finger into the corner of your baby's mouth to break the suction and place it between your baby's gums. Attempt breastfeeding initiation again. Signs of Successful Breastfeeding Signs from your baby:  A   gradual decrease in the number of sucks or complete cessation of sucking.  Falling asleep.  Relaxation of his or her body.  Retention of a small amount of milk in his or her mouth.  Letting go of your breast by himself or herself.  Signs from  you:  Breasts that have increased in firmness, weight, and size 1-3 hours after feeding.  Breasts that are softer immediately after breastfeeding.  Increased milk volume, as well as a change in milk consistency and color by the fifth day of breastfeeding.  Nipples that are not sore, cracked, or bleeding.  Signs That Your Baby is Getting Enough Milk  Wetting at least 1-2 diapers during the first 24 hours after birth.  Wetting at least 5-6 diapers every 24 hours for the first week after birth. The urine should be clear or pale yellow by 5 days after birth.  Wetting 6-8 diapers every 24 hours as your baby continues to grow and develop.  At least 3 stools in a 24-hour period by age 5 days. The stool should be soft and yellow.  At least 3 stools in a 24-hour period by age 7 days. The stool should be seedy and yellow.  No loss of weight greater than 10% of birth weight during the first 3 days of age.  Average weight gain of 4-7 ounces (113-198 g) per week after age 4 days.  Consistent daily weight gain by age 5 days, without weight loss after the age of 2 weeks.  After a feeding, your baby may spit up a small amount. This is common. Breastfeeding frequency and duration Frequent feeding will help you make more milk and can prevent sore nipples and breast engorgement. Breastfeed when you feel the need to reduce the fullness of your breasts or when your baby shows signs of hunger. This is called "breastfeeding on demand." Avoid introducing a pacifier to your baby while you are working to establish breastfeeding (the first 4-6 weeks after your baby is born). After this time you may choose to use a pacifier. Research has shown that pacifier use during the first year of a baby's life decreases the risk of sudden infant death syndrome (SIDS). Allow your baby to feed on each breast as long as he or she wants. Breastfeed until your baby is finished feeding. When your baby unlatches or falls asleep  while feeding from the first breast, offer the second breast. Because newborns are often sleepy in the first few weeks of life, you may need to awaken your baby to get him or her to feed. Breastfeeding times will vary from baby to baby. However, the following rules can serve as a guide to help you ensure that your baby is properly fed:  Newborns (babies 4 weeks of age or younger) may breastfeed every 1-3 hours.  Newborns should not go longer than 3 hours during the day or 5 hours during the night without breastfeeding.  You should breastfeed your baby a minimum of 8 times in a 24-hour period until you begin to introduce solid foods to your baby at around 6 months of age.  Breast milk pumping Pumping and storing breast milk allows you to ensure that your baby is exclusively fed your breast milk, even at times when you are unable to breastfeed. This is especially important if you are going back to work while you are still breastfeeding or when you are not able to be present during feedings. Your lactation consultant can give you guidelines on how   long it is safe to store breast milk. A breast pump is a machine that allows you to pump milk from your breast into a sterile bottle. The pumped breast milk can then be stored in a refrigerator or freezer. Some breast pumps are operated by hand, while others use electricity. Ask your lactation consultant which type will work best for you. Breast pumps can be purchased, but some hospitals and breastfeeding support groups lease breast pumps on a monthly basis. A lactation consultant can teach you how to hand express breast milk, if you prefer not to use a pump. Caring for your breasts while you breastfeed Nipples can become dry, cracked, and sore while breastfeeding. The following recommendations can help keep your breasts moisturized and healthy:  Avoid using soap on your nipples.  Wear a supportive bra. Although not required, special nursing bras and tank  tops are designed to allow access to your breasts for breastfeeding without taking off your entire bra or top. Avoid wearing underwire-style bras or extremely tight bras.  Air dry your nipples for 3-4minutes after each feeding.  Use only cotton bra pads to absorb leaked breast milk. Leaking of breast milk between feedings is normal.  Use lanolin on your nipples after breastfeeding. Lanolin helps to maintain your skin's normal moisture barrier. If you use pure lanolin, you do not need to wash it off before feeding your baby again. Pure lanolin is not toxic to your baby. You may also hand express a few drops of breast milk and gently massage that milk into your nipples and allow the milk to air dry.  In the first few weeks after giving birth, some women experience extremely full breasts (engorgement). Engorgement can make your breasts feel heavy, warm, and tender to the touch. Engorgement peaks within 3-5 days after you give birth. The following recommendations can help ease engorgement:  Completely empty your breasts while breastfeeding or pumping. You may want to start by applying warm, moist heat (in the shower or with warm water-soaked hand towels) just before feeding or pumping. This increases circulation and helps the milk flow. If your baby does not completely empty your breasts while breastfeeding, pump any extra milk after he or she is finished.  Wear a snug bra (nursing or regular) or tank top for 1-2 days to signal your body to slightly decrease milk production.  Apply ice packs to your breasts, unless this is too uncomfortable for you.  Make sure that your baby is latched on and positioned properly while breastfeeding.  If engorgement persists after 48 hours of following these recommendations, contact your health care provider or a lactation consultant. Overall health care recommendations while breastfeeding  Eat healthy foods. Alternate between meals and snacks, eating 3 of each per  day. Because what you eat affects your breast milk, some of the foods may make your baby more irritable than usual. Avoid eating these foods if you are sure that they are negatively affecting your baby.  Drink milk, fruit juice, and water to satisfy your thirst (about 10 glasses a day).  Rest often, relax, and continue to take your prenatal vitamins to prevent fatigue, stress, and anemia.  Continue breast self-awareness checks.  Avoid chewing and smoking tobacco. Chemicals from cigarettes that pass into breast milk and exposure to secondhand smoke may harm your baby.  Avoid alcohol and drug use, including marijuana. Some medicines that may be harmful to your baby can pass through breast milk. It is important to ask your health care   provider before taking any medicine, including all over-the-counter and prescription medicine as well as vitamin and herbal supplements. It is possible to become pregnant while breastfeeding. If birth control is desired, ask your health care provider about options that will be safe for your baby. Contact a health care provider if:  You feel like you want to stop breastfeeding or have become frustrated with breastfeeding.  You have painful breasts or nipples.  Your nipples are cracked or bleeding.  Your breasts are red, tender, or warm.  You have a swollen area on either breast.  You have a fever or chills.  You have nausea or vomiting.  You have drainage other than breast milk from your nipples.  Your breasts do not become full before feedings by the fifth day after you give birth.  You feel sad and depressed.  Your baby is too sleepy to eat well.  Your baby is having trouble sleeping.  Your baby is wetting less than 3 diapers in a 24-hour period.  Your baby has less than 3 stools in a 24-hour period.  Your baby's skin or the white part of his or her eyes becomes yellow.  Your baby is not gaining weight by 5 days of age. Get help right away  if:  Your baby is overly tired (lethargic) and does not want to wake up and feed.  Your baby develops an unexplained fever. This information is not intended to replace advice given to you by your health care provider. Make sure you discuss any questions you have with your health care provider. Document Released: 01/11/2005 Document Revised: 06/25/2015 Document Reviewed: 07/05/2012 Elsevier Interactive Patient Education  2017 Elsevier Inc.  

## 2016-06-23 NOTE — Progress Notes (Signed)
   PRENATAL VISIT NOTE  Subjective:  Taylor Gonzalez is a 21 y.o. G2P1001 at 2451w3d being seen today for transferring prenatal care. [place] [facility] [place]. She is currently monitored for the following issues for this high-risk pregnancy and has GBS bacteriuria; Hyperthyroidism; Supervision of high risk pregnancy, antepartum; and Hyperthyroidism in pregnancy, antepartum on her problem list.  Patient reports no complaints.  Contractions: Not present. Vag. Bleeding: None.  Movement: Present. Denies leaking of fluid.   The following portions of the patient's history were reviewed and updated as appropriate: allergies, current medications, past family history, past medical history, past social history, past surgical history and problem list. Problem list updated.  Objective:   Vitals:   06/23/16 1353  BP: 124/67  Pulse: 98  Weight: 174 lb (78.9 kg)    Fetal Status: Fetal Heart Rate (bpm): 150 Fundal Height: 33 cm Movement: Present     General:  Alert, oriented and cooperative. Patient is in no acute distress.  Skin: Skin is warm and dry. No rash noted.   Cardiovascular: Normal heart rate noted  Respiratory: Normal respiratory effort, no problems with respiration noted  Abdomen: Soft, gravid, appropriate for gestational age. Pain/Pressure: Absent     Pelvic:  Cervical exam deferred        Extremities: Normal range of motion.  Edema: None  Mental Status: Normal mood and affect. Normal behavior. Normal judgment and thought content.   NST:  Baseline: 135 bpm, Variability: Good {> 6 bpm), Accelerations: Reactive and Decelerations: Absent  Assessment and Plan:  Pregnancy: G2P1001 at 7451w3d  1. Supervision of high risk pregnancy, antepartum After exam was complete, it was discovered that [place] [facility] [place] New OB labs done, but physical exam is not done, nor is pap smear. - Obstetric Panel, Including HIV - Culture, OB  Urine - Hemoglobinopathy Evaluation - Glucose Tolerance, 1 Hour Has anatomy u/s scheduled--dated by LMP right now Too late for genetics.  2. Hyperthyroidism in pregnancy, antepartum Given her hyperthyroid, will need endocrinology referral - Ambulatory referral to Endocrinology--awaiting Medicaid - Fetal nonstress test - Culture, OB Urine - Hemoglobinopathy Evaluation - Glucose Tolerance, 1 Hour  3. GBS bacteriuria Will need treatment in labor  4. Hyperthyroidism Previously on PTU--no meds now and TSH is low but Free T3 and Free T4 are WNL--f/u postpartum B-blocker if needed for tachycardia--HR < 100 today.  Preterm labor symptoms and general obstetric precautions including but not limited to vaginal bleeding, contractions, leaking of fluid and fetal movement were reviewed in detail with the patient. Please refer to After Visit Summary for other counseling recommendations.  Return in 2 days (on 06/25/2016) for NST/AFI only @ 0900 if possible;  6/5 Ob fu and NST  .   Reva Boresanya S Shahla Betsill, MD

## 2016-06-24 LAB — GLUCOSE TOLERANCE, 1 HOUR: Glucose, 1Hr PP: 111 mg/dL (ref 65–199)

## 2016-06-25 ENCOUNTER — Ambulatory Visit (INDEPENDENT_AMBULATORY_CARE_PROVIDER_SITE_OTHER): Payer: Self-pay | Admitting: Obstetrics & Gynecology

## 2016-06-25 ENCOUNTER — Ambulatory Visit: Payer: Self-pay

## 2016-06-25 VITALS — BP 121/67 | HR 93

## 2016-06-25 DIAGNOSIS — O99283 Endocrine, nutritional and metabolic diseases complicating pregnancy, third trimester: Secondary | ICD-10-CM

## 2016-06-25 DIAGNOSIS — E0590 Thyrotoxicosis, unspecified without thyrotoxic crisis or storm: Secondary | ICD-10-CM

## 2016-06-25 DIAGNOSIS — O99280 Endocrine, nutritional and metabolic diseases complicating pregnancy, unspecified trimester: Principal | ICD-10-CM

## 2016-06-25 LAB — OBSTETRIC PANEL, INCLUDING HIV
Antibody Screen: NEGATIVE
BASOS ABS: 0 10*3/uL (ref 0.0–0.2)
Basos: 0 %
EOS (ABSOLUTE): 0 10*3/uL (ref 0.0–0.4)
Eos: 0 %
HIV SCREEN 4TH GENERATION: NONREACTIVE
Hematocrit: 26.5 % — ABNORMAL LOW (ref 34.0–46.6)
Hemoglobin: 9.4 g/dL — ABNORMAL LOW (ref 11.1–15.9)
Hepatitis B Surface Ag: NEGATIVE
IMMATURE GRANULOCYTES: 1 %
Immature Grans (Abs): 0.1 10*3/uL (ref 0.0–0.1)
LYMPHS ABS: 1.6 10*3/uL (ref 0.7–3.1)
Lymphs: 19 %
MCH: 26.6 pg (ref 26.6–33.0)
MCHC: 35.5 g/dL (ref 31.5–35.7)
MCV: 75 fL — ABNORMAL LOW (ref 79–97)
MONOS ABS: 0.4 10*3/uL (ref 0.1–0.9)
Monocytes: 5 %
NEUTROS PCT: 75 %
Neutrophils Absolute: 6.4 10*3/uL (ref 1.4–7.0)
PLATELETS: 239 10*3/uL (ref 150–379)
RBC: 3.54 x10E6/uL — AB (ref 3.77–5.28)
RDW: 14.7 % (ref 12.3–15.4)
RH TYPE: POSITIVE
RPR Ser Ql: NONREACTIVE
Rubella Antibodies, IGG: 2.14 index (ref >0.99)
WBC: 8.5 10*3/uL (ref 3.4–10.8)

## 2016-06-25 LAB — HEMOGLOBINOPATHY EVALUATION
FERRITIN: 6 ng/mL — AB (ref 15–150)
HGB VARIANT: 0 %
Hgb A2 Quant: 2.5 % (ref 1.8–3.2)
Hgb A: 97.5 % (ref 96.4–98.8)
Hgb C: 0 %
Hgb F Quant: 0 % (ref 0.0–2.0)
Hgb S: 0 %
Hgb Solubility: NEGATIVE

## 2016-06-25 NOTE — Progress Notes (Signed)
Pt informed that the ultrasound is considered a limited OB ultrasound and is not intended to be a complete ultrasound exam.  Patient also informed that the ultrasound is not being completed with the intent of assessing for fetal or placental anomalies or any pelvic abnormalities.  Explained that the purpose of today's ultrasound is to assess for presentation and amniotic fluid volume.  Patient acknowledges the purpose of the exam and the limitations of the study.    US scheduled 6/8, BPP added.

## 2016-06-28 LAB — URINE CULTURE, OB REFLEX

## 2016-06-28 LAB — CULTURE, OB URINE

## 2016-06-30 ENCOUNTER — Other Ambulatory Visit: Payer: Self-pay | Admitting: Obstetrics & Gynecology

## 2016-07-02 ENCOUNTER — Other Ambulatory Visit (HOSPITAL_COMMUNITY): Payer: Self-pay | Admitting: Advanced Practice Midwife

## 2016-07-02 ENCOUNTER — Ambulatory Visit (HOSPITAL_COMMUNITY)
Admission: RE | Admit: 2016-07-02 | Discharge: 2016-07-02 | Disposition: A | Discharge status: Home / Self Care | Payer: Medicaid Other | Source: Ambulatory Visit | Attending: Advanced Practice Midwife | Admitting: Advanced Practice Midwife

## 2016-07-02 DIAGNOSIS — Z3A36 36 weeks gestation of pregnancy: Secondary | ICD-10-CM

## 2016-07-02 DIAGNOSIS — Z369 Encounter for antenatal screening, unspecified: Secondary | ICD-10-CM

## 2016-07-02 DIAGNOSIS — R000 Tachycardia, unspecified: Secondary | ICD-10-CM

## 2016-07-02 DIAGNOSIS — O99283 Endocrine, nutritional and metabolic diseases complicating pregnancy, third trimester: Secondary | ICD-10-CM | POA: Diagnosis present

## 2016-07-02 DIAGNOSIS — O0933 Supervision of pregnancy with insufficient antenatal care, third trimester: Secondary | ICD-10-CM | POA: Diagnosis present

## 2016-07-02 DIAGNOSIS — Z3A34 34 weeks gestation of pregnancy: Secondary | ICD-10-CM | POA: Insufficient documentation

## 2016-07-02 DIAGNOSIS — Z363 Encounter for antenatal screening for malformations: Secondary | ICD-10-CM | POA: Diagnosis not present

## 2016-07-02 DIAGNOSIS — E0590 Thyrotoxicosis, unspecified without thyrotoxic crisis or storm: Secondary | ICD-10-CM

## 2016-07-02 DIAGNOSIS — O99280 Endocrine, nutritional and metabolic diseases complicating pregnancy, unspecified trimester: Secondary | ICD-10-CM

## 2016-07-08 ENCOUNTER — Encounter: Payer: Self-pay | Admitting: Obstetrics and Gynecology

## 2016-07-08 ENCOUNTER — Other Ambulatory Visit: Payer: Self-pay | Admitting: Obstetrics and Gynecology

## 2016-07-08 NOTE — Progress Notes (Signed)
Patient did not keep OB appointment for 07/08/2016.  Wilmont Olund, Jr MD Attending [facility] (Faculty Practice)   

## 2016-07-15 ENCOUNTER — Telehealth: Payer: Self-pay | Admitting: *Deleted

## 2016-07-15 NOTE — Telephone Encounter (Signed)
Called pt to discuss missed appts in this office.  She stated that she did not know she had any appts scheduled. I reviewed with her our discussion when she was here on 6/1 and that she would need to be seen twice weekly until her baby is delivered. She reports good FM daily and is having no problems.  I asked if she would be able to come for appt tomorrow @ 0900 and she replied yes.  I advised that she will see the doctor and have NST.  Pt voiced understanding.

## 2016-07-16 ENCOUNTER — Ambulatory Visit (INDEPENDENT_AMBULATORY_CARE_PROVIDER_SITE_OTHER): Payer: Self-pay | Admitting: Obstetrics and Gynecology

## 2016-07-16 VITALS — BP 131/75 | HR 112 | Wt 182.4 lb

## 2016-07-16 DIAGNOSIS — O99283 Endocrine, nutritional and metabolic diseases complicating pregnancy, third trimester: Secondary | ICD-10-CM

## 2016-07-16 DIAGNOSIS — O0993 Supervision of high risk pregnancy, unspecified, third trimester: Secondary | ICD-10-CM

## 2016-07-16 DIAGNOSIS — R8271 Bacteriuria: Secondary | ICD-10-CM

## 2016-07-16 DIAGNOSIS — O0990 Supervision of high risk pregnancy, unspecified, unspecified trimester: Secondary | ICD-10-CM

## 2016-07-16 DIAGNOSIS — E0590 Thyrotoxicosis, unspecified without thyrotoxic crisis or storm: Secondary | ICD-10-CM

## 2016-07-16 DIAGNOSIS — Z113 Encounter for screening for infections with a predominantly sexual mode of transmission: Secondary | ICD-10-CM

## 2016-07-16 DIAGNOSIS — O99280 Endocrine, nutritional and metabolic diseases complicating pregnancy, unspecified trimester: Principal | ICD-10-CM

## 2016-07-16 NOTE — Progress Notes (Signed)
Subjective:  Taylor Gonzalez is a 21 y.o. G2P1001 at 6484w5d being seen today for ongoing prenatal care.  She is currently monitored for the following issues for this high-risk pregnancy and has GBS bacteriuria; Hyperthyroidism; Supervision of high risk pregnancy, antepartum; and Hyperthyroidism in pregnancy, antepartum on her problem list.  Patient reports no complaints.  Contractions: Not present. Vag. Bleeding: None.  Movement: Present. Denies leaking of fluid.   The following portions of the patient's history were reviewed and updated as appropriate: allergies, current medications, past family history, past medical history, past social history, past surgical history and problem list. Problem list updated.  Objective:   Vitals:   07/16/16 1109  Weight: 182 lb 6.4 oz (82.7 kg)    Fetal Status: Fetal Heart Rate (bpm): NST   Movement: Present     General:  Alert, oriented and cooperative. Patient is in no acute distress.  Skin: Skin is warm and dry. No rash noted.   Cardiovascular: Normal heart rate noted  Respiratory: Normal respiratory effort, no problems with respiration noted  Abdomen: Soft, gravid, appropriate for gestational age. Pain/Pressure: Absent     Pelvic:  Cervical exam performed        Extremities: Normal range of motion.  Edema: None  Mental Status: Normal mood and affect. Normal behavior. Normal judgment and thought content.   Urinalysis:      Assessment and Plan:  Pregnancy: G2P1001 at 3184w5d  1. Hyperthyroidism in pregnancy, antepartum Still awaiting Medicaid  RNST today - US MFM FETAL BPP WO NON STRESS; Future Will have U/S for growth, BPP and NST in MFM 2. Supervision of high risk pregnancy, antepartum  - US MFM OB FOLLOW UP; Future - Cervicovaginal ancillary only  3. GBS bacteriuria Tx while in labor  Term labor symptoms and general obstetric precautions including but not limited to vaginal bleeding, contractions, leaking of fluid and  fetal movement were reviewed in detail with the patient. Please refer to After Visit Summary for other counseling recommendations.  Return in about 1 week (around 07/23/2016) for OB visit.   Hermina StaggersErvin, Brick Ketcher L, MD

## 2016-07-19 LAB — CERVICOVAGINAL ANCILLARY ONLY
Chlamydia: NEGATIVE
NEISSERIA GONORRHEA: NEGATIVE

## 2016-07-22 ENCOUNTER — Encounter: Payer: Self-pay | Admitting: Obstetrics and Gynecology

## 2016-07-23 ENCOUNTER — Ambulatory Visit (HOSPITAL_COMMUNITY): Admission: RE | Admit: 2016-07-23 | Payer: Self-pay | Source: Ambulatory Visit

## 2016-07-23 ENCOUNTER — Other Ambulatory Visit: Payer: Self-pay | Admitting: Obstetrics & Gynecology

## 2016-07-23 ENCOUNTER — Ambulatory Visit (HOSPITAL_COMMUNITY): Payer: Self-pay

## 2016-07-29 ENCOUNTER — Inpatient Hospital Stay (HOSPITAL_COMMUNITY): Payer: Medicaid Other

## 2016-07-29 ENCOUNTER — Observation Stay (HOSPITAL_COMMUNITY)
Admission: AD | Admit: 2016-07-29 | Discharge: 2016-07-30 | Disposition: A | Discharge status: Home / Self Care | Payer: Medicaid Other | Source: Ambulatory Visit | Attending: Obstetrics and Gynecology | Admitting: Obstetrics and Gynecology

## 2016-07-29 ENCOUNTER — Encounter (HOSPITAL_COMMUNITY): Payer: Self-pay

## 2016-07-29 ENCOUNTER — Ambulatory Visit (INDEPENDENT_AMBULATORY_CARE_PROVIDER_SITE_OTHER): Payer: Self-pay | Admitting: *Deleted

## 2016-07-29 VITALS — BP 125/63 | HR 92

## 2016-07-29 DIAGNOSIS — Z3A38 38 weeks gestation of pregnancy: Secondary | ICD-10-CM

## 2016-07-29 DIAGNOSIS — O288 Other abnormal findings on antenatal screening of mother: Secondary | ICD-10-CM

## 2016-07-29 DIAGNOSIS — E0590 Thyrotoxicosis, unspecified without thyrotoxic crisis or storm: Secondary | ICD-10-CM | POA: Insufficient documentation

## 2016-07-29 DIAGNOSIS — O2693 Pregnancy related conditions, unspecified, third trimester: Secondary | ICD-10-CM | POA: Diagnosis not present

## 2016-07-29 DIAGNOSIS — O99820 Streptococcus B carrier state complicating pregnancy: Secondary | ICD-10-CM | POA: Insufficient documentation

## 2016-07-29 DIAGNOSIS — O368131 Decreased fetal movements, third trimester, fetus 1: Principal | ICD-10-CM | POA: Insufficient documentation

## 2016-07-29 DIAGNOSIS — O0933 Supervision of pregnancy with insufficient antenatal care, third trimester: Secondary | ICD-10-CM | POA: Insufficient documentation

## 2016-07-29 DIAGNOSIS — O99283 Endocrine, nutritional and metabolic diseases complicating pregnancy, third trimester: Secondary | ICD-10-CM

## 2016-07-29 DIAGNOSIS — O99280 Endocrine, nutritional and metabolic diseases complicating pregnancy, unspecified trimester: Principal | ICD-10-CM

## 2016-07-29 MED ORDER — DOCUSATE SODIUM 100 MG PO CAPS: 100.0000 mg | ORAL_CAPSULE | Freq: Every day | ORAL | Status: DC

## 2016-07-29 MED ORDER — PRENATAL MULTIVITAMIN CH: 1.0000 | ORAL_TABLET | Freq: Every day | ORAL | Status: DC

## 2016-07-29 MED ORDER — CALCIUM CARBONATE ANTACID 500 MG PO CHEW: 2.0000 | CHEWABLE_TABLET | ORAL | Status: DC | PRN

## 2016-07-29 MED ORDER — ZOLPIDEM TARTRATE 5 MG PO TABS: 5.0000 mg | ORAL_TABLET | Freq: Every evening | ORAL | Status: DC | PRN

## 2016-07-29 MED ORDER — METOPROLOL SUCCINATE ER 25 MG PO TB24
25.0000 mg | ORAL_TABLET | Freq: Every day | ORAL | Status: DC
  Filled 2016-07-29 (×2): qty 1

## 2016-07-29 MED ORDER — ACETAMINOPHEN 325 MG PO TABS: 650.0000 mg | ORAL_TABLET | ORAL | Status: DC | PRN

## 2016-07-29 NOTE — Progress Notes (Signed)
Per chart review, pt had initial US on 6/8.  MFM recommends BEDD 08/10/16 per measurements taken that day due to 2w 2d difference from LMP.   Pt had NR NST today.  Pt sent to MAU for further evaluation and to have BPP.   Dr. Jolayne Pantheronstant notified.

## 2016-07-29 NOTE — H&P (Signed)
Taylor Gonzalez is a 21 y.o. female presenting for NRNST. Reports good FM. No VB, LOF, or ctx. 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 Evaluated in MAU and found to have BPP 6/8 (off breathing) and anl AFI (13 cm).   OB History    Gravida Para Term Preterm AB Living   2 1 1  0 0 1   SAB TAB Ectopic Multiple Live Births   0 0 0 0 1     Past Medical History:  Diagnosis Date  . Hyperthyroidism   . Thyroid disease    Past Surgical History:  Procedure Laterality Date  . NO PAST SURGERIES     Family History: family history includes Diabetes in her maternal grandfather, maternal grandmother, and paternal grandmother; Hypertension in her father, maternal grandfather, and paternal grandmother. Social History:  reports that she has never smoked. She has never used smokeless tobacco. She reports that she does not drink alcohol or use drugs.     Maternal Diabetes: No Genetic Screening: Declined Maternal Ultrasounds/Referrals: Normal Fetal Ultrasounds or other Referrals:  None Maternal Substance Abuse:  No Significant Maternal Medications:  None Significant Maternal Lab Results:  Lab values include: Group B Strep positive Other Comments:  Hyperthyroidism  Review of Systems  Gastrointestinal: Negative for abdominal pain.   History   Blood pressure 119/70, pulse 91, temperature 98.8 F (37.1 C), temperature source Oral, resp. rate 18, last menstrual period 10/19/2015, unknown if currently breastfeeding. Maternal Exam:  Uterine Assessment: Contraction frequency is irregular.      Fetal Exam Fetal Monitor Review: Mode: ultrasound.   Baseline rate: 135.  Variability: moderate (6-25 bpm).   Pattern: no accelerations and no decelerations.    Fetal State Assessment: Category I - tracings are normal.     Physical Exam  Constitutional: She is oriented to person, place, and time. She appears well-developed and well-nourished. No distress.   HENT:  Head: Normocephalic and atraumatic.  Neck: Normal range of motion.  Cardiovascular: Normal rate.   Respiratory: Effort normal.  GI: Soft. She exhibits no distension. There is no tenderness.  Musculoskeletal: Normal range of motion.  Neurological: She is alert and oriented to person, place, and time.  Skin: Skin is warm and dry.  Psychiatric: She has a normal mood and affect.    Prenatal labs: ABO, Rh: B/Positive/-- (05/30 1456) Antibody: Negative (05/30 1456) Rubella: 2.14 (05/30 1456) RPR: Non Reactive (05/30 1456)  HBsAg: Negative (05/30 1456)  HIV: Non Reactive (05/30 1456)  GBS:   POS  Assessment/Plan: 38 weeks Nonreactive NST Obs to Ante Repeat BPP in am Mngt per Dr. Jodelle Redonstant   Taylor Gonzalez, CNM 07/29/2016, 7:26 PM

## 2016-07-29 NOTE — MAU Note (Signed)
Pt sent from clinic for nonreactive NST.  Pt reports good FM, denies contractions, bleeding or LOF.

## 2016-07-30 ENCOUNTER — Ambulatory Visit (HOSPITAL_COMMUNITY): Payer: Medicaid Other

## 2016-07-30 DIAGNOSIS — O288 Other abnormal findings on antenatal screening of mother: Secondary | ICD-10-CM

## 2016-07-30 DIAGNOSIS — Z3A38 38 weeks gestation of pregnancy: Secondary | ICD-10-CM

## 2016-07-30 NOTE — Progress Notes (Signed)
Dr. Alysia PennaErvin notified to review FHR tracing. Dr. Alysia PennaErvin also notified of BPP of 8/8 this morning. Instructed to leave patient on fetal monitoring and he will evaluate. Carmelina DaneERRI L Chealsey Miyamoto, RN

## 2016-07-30 NOTE — Discharge Summary (Signed)
Physician Discharge Summary  Patient ID: Taylor Gonzalez MRN: [id number] DOB/AGE: 05-23-1995 21 y.o.  Admit date: 07/29/2016 Discharge date: 07/30/2016  Admission Diagnoses: IUP 38 3/7  Non reactive NSt and 6/8 BPP  Discharge Diagnoses: SAA Active Problems:   NST (non-stress test) nonreactive   Discharged Condition: good  Hospital Course: Pt was observed overnight for above indications. Repeat BPP was 8/8. Pt reported good fetal movement. Amendable for discharge home.  Consults: None  Significant Diagnostic Studies: BPP  Treatments: IV hydration  Discharge Exam: Blood pressure 129/73, pulse 90, temperature 98.7 F (37.1 C), temperature source Oral, resp. rate 16, height 5\' 2"  (1.575 m), weight 83.5 kg (184 lb), last menstrual period 10/19/2015, SpO2 100 %, unknown if currently breastfeeding.   Lungs clear Heart RRR Abd soft + BS gravid GU deferred Ext non tender  Disposition: 01-Home or Self Care  Discharge Instructions    Discharge activity:  No Restrictions    Complete by:  As directed    Discharge diet:  No restrictions    Complete by:  As directed    Fetal Kick Count:  Lie on our left side for one hour after a meal, and count the number of times your baby kicks.  If it is less than 5 times, get up, move around and drink some juice.  Repeat the test 30 minutes later.  If it is still less than 5 kicks in an hour, notify your doctor.    Complete by:  As directed    LABOR:  When conractions begin, you should start to time them from the beginning of one contraction to the beginning  of the next.  When contractions are 5 - 10 minutes apart or less and have been regular for at least an hour, you should call your health care provider.    Complete by:  As directed    Notify physician for bleeding from the vagina    Complete by:  As directed    Notify physician for blurring of vision or spots before the eyes    Complete by:  As directed    Notify physician for  chills or fever    Complete by:  As directed    Notify physician for fainting spells, "black outs" or loss of consciousness    Complete by:  As directed    Notify physician for increase in vaginal discharge    Complete by:  As directed    Notify physician for leaking of fluid    Complete by:  As directed    Notify physician for pain or burning when urinating    Complete by:  As directed    Notify physician for pelvic pressure (sudden increase)    Complete by:  As directed    Notify physician for severe or continued nausea or vomiting    Complete by:  As directed    Notify physician for sudden gushing of fluid from the vagina (with or without continued leaking)    Complete by:  As directed    Notify physician for sudden, constant, or occasional abdominal pain    Complete by:  As directed    Notify physician if baby moving less than usual    Complete by:  As directed      Allergies as of 07/30/2016   No Known Allergies     Medication List    You have not been prescribed any medications.    Follow-up Information    [facility] Follow up.  Why:  Pt already has appt on Monday Contact information: [address] [place] [id number] [phone]          Signed: Hermina StaggersMichael L Adon Gehlhausen 07/30/2016, 1:40 PM

## 2016-07-30 NOTE — Progress Notes (Signed)
Contact with MFM in regards to order placed for BPP. MFM states that will contact us when ready. Carmelina DaneERRI L Ashiyah Pavlak, RN

## 2016-07-30 NOTE — Progress Notes (Signed)
Please see Dr. Eugenie FillerMichael Irvin discharge summary

## 2016-07-30 NOTE — Progress Notes (Signed)
Pt discharged with printed instructions. Pt verbalized an understanding. No concerns noted. Leliana Kontz L Yailin Biederman, RN 

## 2016-08-02 ENCOUNTER — Other Ambulatory Visit: Payer: Self-pay | Admitting: Family Medicine

## 2016-08-05 ENCOUNTER — Other Ambulatory Visit: Payer: Self-pay

## 2016-08-05 ENCOUNTER — Ambulatory Visit (INDEPENDENT_AMBULATORY_CARE_PROVIDER_SITE_OTHER): Payer: Self-pay | Admitting: Obstetrics and Gynecology

## 2016-08-05 ENCOUNTER — Ambulatory Visit: Payer: Self-pay

## 2016-08-05 VITALS — BP 122/65 | HR 97 | Wt 188.6 lb

## 2016-08-05 DIAGNOSIS — E0590 Thyrotoxicosis, unspecified without thyrotoxic crisis or storm: Secondary | ICD-10-CM

## 2016-08-05 DIAGNOSIS — O0993 Supervision of high risk pregnancy, unspecified, third trimester: Secondary | ICD-10-CM

## 2016-08-05 DIAGNOSIS — O99283 Endocrine, nutritional and metabolic diseases complicating pregnancy, third trimester: Secondary | ICD-10-CM

## 2016-08-05 DIAGNOSIS — O99280 Endocrine, nutritional and metabolic diseases complicating pregnancy, unspecified trimester: Principal | ICD-10-CM

## 2016-08-05 DIAGNOSIS — O0990 Supervision of high risk pregnancy, unspecified, unspecified trimester: Secondary | ICD-10-CM

## 2016-08-05 DIAGNOSIS — R8271 Bacteriuria: Secondary | ICD-10-CM

## 2016-08-05 NOTE — Progress Notes (Signed)
Pt DNKA on 7/9 for Ob fu. She states that she stopped taking PTU several months ago because she ran out of medication and did not ask for a refill.

## 2016-08-05 NOTE — Progress Notes (Signed)
Subjective:  Taylor Gonzalez is a 21 y.o. G2P1001 at 4325w2d being seen today for ongoing prenatal care.  She is currently monitored for the following issues for this high-risk pregnancy and has GBS bacteriuria; Hyperthyroidism; Supervision of high risk pregnancy, antepartum; and Hyperthyroidism in pregnancy, antepartum on her problem list.  Patient reports no complaints.  Contractions: Not present. Vag. Bleeding: None.  Movement: Present. Denies leaking of fluid.   The following portions of the patient's history were reviewed and updated as appropriate: allergies, current medications, past family history, past medical history, past social history, past surgical history and problem list. Problem list updated.  Objective:   Vitals:   08/05/16 1250  BP: 122/65  Pulse: 97  Weight: 188 lb 9.6 oz (85.5 kg)    Fetal Status: Fetal Heart Rate (bpm): NST   Movement: Present  Presentation: Vertex  General:  Alert, oriented and cooperative. Patient is in no acute distress.  Skin: Skin is warm and dry. No rash noted.   Cardiovascular: Normal heart rate noted  Respiratory: Normal respiratory effort, no problems with respiration noted  Abdomen: Soft, gravid, appropriate for gestational age. Pain/Pressure: Absent     Pelvic:  Cervical exam deferred        Extremities: Normal range of motion.  Edema: Trace  Mental Status: Normal mood and affect. Normal behavior. Normal judgment and thought content.   Urinalysis:      Assessment and Plan:  Pregnancy: G2P1001 at 4625w2d  1. Hyperthyroidism in pregnancy, antepartum Pt has not been taking PTU, d/t no medicaid. Will have SW see pt while in hospital to assist with medication - Fetal nonstress test, reactive - US OB Limited  2. Supervision of high risk pregnancy, antepartum IOL tomorrow  3. GBS bacteriuria Tx while in labor  Term labor symptoms and general obstetric precautions including but not limited to vaginal bleeding,  contractions, leaking of fluid and fetal movement were reviewed in detail with the patient. Please refer to After Visit Summary for other counseling recommendations.  Return in about 4 weeks (around 09/02/2016).   Hermina StaggersErvin, Nolawi Kanady L, MD

## 2016-08-06 ENCOUNTER — Encounter (HOSPITAL_COMMUNITY): Payer: Self-pay

## 2016-08-06 ENCOUNTER — Inpatient Hospital Stay (HOSPITAL_COMMUNITY): Payer: Medicaid Other | Admitting: Anesthesiology

## 2016-08-06 ENCOUNTER — Inpatient Hospital Stay (HOSPITAL_COMMUNITY)
Admission: RE | Admit: 2016-08-06 | Discharge: 2016-08-09 | DRG: 775 | Disposition: A | Discharge status: Home / Self Care | Payer: Medicaid Other | Source: Ambulatory Visit | Attending: Obstetrics and Gynecology | Admitting: Obstetrics and Gynecology

## 2016-08-06 DIAGNOSIS — Z3A39 39 weeks gestation of pregnancy: Secondary | ICD-10-CM | POA: Diagnosis not present

## 2016-08-06 DIAGNOSIS — O99280 Endocrine, nutritional and metabolic diseases complicating pregnancy, unspecified trimester: Secondary | ICD-10-CM

## 2016-08-06 DIAGNOSIS — E0590 Thyrotoxicosis, unspecified without thyrotoxic crisis or storm: Secondary | ICD-10-CM | POA: Diagnosis present

## 2016-08-06 DIAGNOSIS — O99283 Endocrine, nutritional and metabolic diseases complicating pregnancy, third trimester: Secondary | ICD-10-CM

## 2016-08-06 DIAGNOSIS — R8271 Bacteriuria: Secondary | ICD-10-CM

## 2016-08-06 DIAGNOSIS — O99284 Endocrine, nutritional and metabolic diseases complicating childbirth: Principal | ICD-10-CM | POA: Diagnosis present

## 2016-08-06 DIAGNOSIS — O0990 Supervision of high risk pregnancy, unspecified, unspecified trimester: Secondary | ICD-10-CM

## 2016-08-06 DIAGNOSIS — O99824 Streptococcus B carrier state complicating childbirth: Secondary | ICD-10-CM | POA: Diagnosis present

## 2016-08-06 LAB — CBC
HEMATOCRIT: 32.3 % — AB (ref 36.0–46.0)
HEMOGLOBIN: 10.1 g/dL — AB (ref 12.0–15.0)
MCH: 25.3 pg — AB (ref 26.0–34.0)
MCHC: 31.3 g/dL (ref 30.0–36.0)
MCV: 80.8 fL (ref 78.0–100.0)
Platelets: 292 10*3/uL (ref 150–400)
RBC: 4 MIL/uL (ref 3.87–5.11)
RDW: 15.7 % — ABNORMAL HIGH (ref 11.5–15.5)
WBC: 8.5 10*3/uL (ref 4.0–10.5)

## 2016-08-06 LAB — TYPE AND SCREEN
ABO/RH(D): B POS
Antibody Screen: NEGATIVE

## 2016-08-06 LAB — RPR: RPR: NONREACTIVE

## 2016-08-06 MED ORDER — OXYTOCIN 40 UNITS IN LACTATED RINGERS INFUSION - SIMPLE MED
1.0000 m[IU]/min | INTRAVENOUS | Status: DC
  Administered 2016-08-06: 2 m[IU]/min via INTRAVENOUS
  Filled 2016-08-06: qty 1000

## 2016-08-06 MED ORDER — PHENYLEPHRINE 40 MCG/ML (10ML) SYRINGE FOR IV PUSH (FOR BLOOD PRESSURE SUPPORT)
80.0000 ug | PREFILLED_SYRINGE | INTRAVENOUS | Status: DC | PRN
  Filled 2016-08-06: qty 10
  Filled 2016-08-06: qty 5

## 2016-08-06 MED ORDER — PHENYLEPHRINE 40 MCG/ML (10ML) SYRINGE FOR IV PUSH (FOR BLOOD PRESSURE SUPPORT)
80.0000 ug | PREFILLED_SYRINGE | INTRAVENOUS | Status: DC | PRN
  Filled 2016-08-06: qty 5

## 2016-08-06 MED ORDER — OXYCODONE-ACETAMINOPHEN 5-325 MG PO TABS: 2.0000 | ORAL_TABLET | ORAL | Status: DC | PRN

## 2016-08-06 MED ORDER — PENICILLIN G POT IN DEXTROSE 60000 UNIT/ML IV SOLN
3.0000 10*6.[IU] | INTRAVENOUS | Status: DC
  Administered 2016-08-06 – 2016-08-07 (×5): 3 10*6.[IU] via INTRAVENOUS
  Filled 2016-08-06 (×7): qty 50

## 2016-08-06 MED ORDER — PENICILLIN G POTASSIUM 5000000 UNITS IJ SOLR
5.0000 10*6.[IU] | Freq: Once | INTRAMUSCULAR | Status: AC
  Administered 2016-08-06: 5 10*6.[IU] via INTRAVENOUS
  Filled 2016-08-06: qty 5

## 2016-08-06 MED ORDER — EPHEDRINE 5 MG/ML INJ
10.0000 mg | INTRAVENOUS | Status: DC | PRN
  Filled 2016-08-06: qty 2

## 2016-08-06 MED ORDER — LIDOCAINE HCL (PF) 1 % IJ SOLN
INTRAMUSCULAR | Status: DC | PRN
  Administered 2016-08-06 (×2): 5 mL

## 2016-08-06 MED ORDER — TERBUTALINE SULFATE 1 MG/ML IJ SOLN
0.2500 mg | Freq: Once | INTRAMUSCULAR | Status: DC | PRN
  Filled 2016-08-06: qty 1

## 2016-08-06 MED ORDER — DIPHENHYDRAMINE HCL 50 MG/ML IJ SOLN: 12.5000 mg | INTRAMUSCULAR | Status: DC | PRN

## 2016-08-06 MED ORDER — OXYTOCIN BOLUS FROM INFUSION
500.0000 mL | Freq: Once | INTRAVENOUS | Status: AC
  Administered 2016-08-07: 500 mL via INTRAVENOUS

## 2016-08-06 MED ORDER — LACTATED RINGERS IV SOLN
INTRAVENOUS | Status: DC
  Administered 2016-08-06 (×3): via INTRAVENOUS

## 2016-08-06 MED ORDER — ONDANSETRON HCL 4 MG/2ML IJ SOLN
4.0000 mg | Freq: Four times a day (QID) | INTRAMUSCULAR | Status: DC | PRN
  Administered 2016-08-07 (×2): 4 mg via INTRAVENOUS
  Filled 2016-08-06 (×2): qty 2

## 2016-08-06 MED ORDER — FENTANYL 2.5 MCG/ML BUPIVACAINE 1/10 % EPIDURAL INFUSION (WH - ANES)
14.0000 mL/h | INTRAMUSCULAR | Status: DC | PRN
  Administered 2016-08-06 – 2016-08-07 (×2): 14 mL/h via EPIDURAL
  Filled 2016-08-06 (×2): qty 100

## 2016-08-06 MED ORDER — LACTATED RINGERS IV SOLN: 500.0000 mL | INTRAVENOUS | Status: DC | PRN

## 2016-08-06 MED ORDER — LIDOCAINE HCL (PF) 1 % IJ SOLN
30.0000 mL | INTRAMUSCULAR | Status: DC | PRN
  Filled 2016-08-06: qty 30

## 2016-08-06 MED ORDER — SOD CITRATE-CITRIC ACID 500-334 MG/5ML PO SOLN
30.0000 mL | ORAL | Status: DC | PRN
Start: 2016-08-06 — End: 2016-08-07

## 2016-08-06 MED ORDER — OXYTOCIN 40 UNITS IN LACTATED RINGERS INFUSION - SIMPLE MED: 2.5000 [IU]/h | INTRAVENOUS | Status: DC

## 2016-08-06 MED ORDER — MISOPROSTOL 25 MCG QUARTER TABLET
25.0000 ug | ORAL_TABLET | ORAL | Status: DC | PRN
Start: 2016-08-06 — End: 2016-08-07
  Administered 2016-08-06: 25 ug via VAGINAL
  Filled 2016-08-06 (×2): qty 1

## 2016-08-06 MED ORDER — FENTANYL CITRATE (PF) 100 MCG/2ML IJ SOLN
100.0000 ug | INTRAMUSCULAR | Status: DC | PRN
  Administered 2016-08-06 (×2): 100 ug via INTRAVENOUS
  Filled 2016-08-06 (×2): qty 2

## 2016-08-06 MED ORDER — ACETAMINOPHEN 325 MG PO TABS
650.0000 mg | ORAL_TABLET | ORAL | Status: DC | PRN
Start: 2016-08-06 — End: 2016-08-07

## 2016-08-06 MED ORDER — LACTATED RINGERS IV SOLN
500.0000 mL | Freq: Once | INTRAVENOUS | Status: AC
  Administered 2016-08-06: 500 mL via INTRAVENOUS

## 2016-08-06 MED ORDER — OXYCODONE-ACETAMINOPHEN 5-325 MG PO TABS: 1.0000 | ORAL_TABLET | ORAL | Status: DC | PRN

## 2016-08-06 NOTE — H&P (Signed)
Taylor Gonzalez is a 21 y.o. female G2P1001 @[redacted]w[redacted]d  presenting for IOL for hyperthyroid.  She reports irregular mild cramping on admission.  She reports good fetal movement, denies LOF, vaginal bleeding, vaginal itching/burning, urinary symptoms, h/a, dizziness, n/v, or fever/chills.    Clinic [facility] Prenatal Labs  Dating LMP Blood type:   B pos  Genetic Screen Too late Antibody: Neg  Anatomic US 07/02/16 Rubella: 2.41 (05/22 1359)  GTT Third trimester: 111 RPR: Non Reactive (05/22 1359)   Flu vaccine declined HBsAg: Negative (05/22 1359)   TDaP vaccine declined                                              Rhogam: HIV: Non Reactive (05/22 1359)   Baby Food breast                                              GBS: Positive  Contraception Undecided- info given Pap:  Circumcision N/a female   [facility]   Support Person Adela LankJacqueline (mother)   Prenatal Classes     OB History    Gravida Para Term Preterm AB Living   2 1 1  0 0 1   SAB TAB Ectopic Multiple Live Births   0 0 0 0 1     Past Medical History:  Diagnosis Date  . Hyperthyroidism   . Thyroid disease    Past Surgical History:  Procedure Laterality Date  . NO PAST SURGERIES     Family History: family history includes Diabetes in her maternal grandfather, maternal grandmother, and paternal grandmother; Hypertension in her father, maternal grandfather, and paternal grandmother. Social History:  reports that she has never smoked. She has never used smokeless tobacco. She reports that she does not drink alcohol or use drugs.     Maternal Diabetes: No Genetic Screening: Declined Maternal Ultrasounds/Referrals: Normal Fetal Ultrasounds or other Referrals:  None Maternal Substance Abuse:  No Significant Maternal Medications:  None Significant Maternal Lab Results:  Lab values include: Group B Strep positive Other Comments:  None  Review of Systems  Constitutional: Negative for chills, fever  and malaise/fatigue.  Eyes: Negative for blurred vision.  Respiratory: Negative for cough and shortness of breath.   Cardiovascular: Negative for chest pain.  Gastrointestinal: Positive for abdominal pain. Negative for heartburn and vomiting.  Genitourinary: Negative for dysuria, frequency and urgency.  Musculoskeletal: Negative.   Neurological: Negative for dizziness and headaches.  Psychiatric/Behavioral: Negative for depression.   Maternal Medical History:  Reason for admission: IOL for hyperthyroid   Contractions: Onset was 3-5 hours ago.   Frequency: irregular.   Perceived severity is mild.    Fetal activity: Perceived fetal activity is normal.   Last perceived fetal movement was within the past hour.    Prenatal complications: no prenatal complications Prenatal Complications - Diabetes: none.    Dilation: 0 Effacement (%): 50 Station: -2 Exam by:: Leftwitchkirby, CNM  Blood pressure 132/70, pulse 90, temperature 98.3 F (36.8 C), temperature source Oral, height 5\' 2"  (1.575 m), weight 188 lb (85.3 kg), last menstrual period 10/19/2015, unknown if currently breastfeeding. Maternal Exam:  Uterine Assessment: Contraction strength is mild.  Contraction frequency is irregular.   Abdomen: Fetal presentation: vertex  Fetal Exam Fetal Monitor Review: Mode: ultrasound.   Baseline rate: 135.  Variability: moderate (6-25 bpm).   Pattern: accelerations present.    Fetal State Assessment: Category I - tracings are normal.     Physical Exam  Nursing note and vitals reviewed. Constitutional: She is oriented to person, place, and time. She appears well-developed and well-nourished.  Neck: Normal range of motion.  Cardiovascular: Normal rate, regular rhythm and normal heart sounds.   Respiratory: Effort normal.  GI: Soft.  Musculoskeletal: Normal range of motion.  Neurological: She is alert and oriented to person, place, and time.  Skin: Skin is warm and dry.   Psychiatric: She has a normal mood and affect. Her behavior is normal. Judgment and thought content normal.    Prenatal labs: ABO, Rh: --/--/B POS (07/13 0800) Antibody: NEG (07/13 0800) Rubella: 2.14 (05/30 1456) RPR: Non Reactive (07/13 0800)  HBsAg: Negative (05/30 1456)  HIV: Non Reactive (05/30 1456)  GBS: Positive (05/22 0000)   Assessment/Plan: 1. GBS bacteriuria   2. Supervision of high risk pregnancy, antepartum   3. Hyperthyroidism in pregnancy, antepartum     IOL for hyperthyroid at 39 weeks Cytotec PV Anticipate NSVD   Sharen Counter 08/06/2016, 4:08 PM

## 2016-08-06 NOTE — Progress Notes (Signed)
Taylor Gonzalez is a 21 y.o. G2P1001 at 5912w3d admitted for induction of labor due to hyperthyroid.  Subjective: Pt reports mild cramping, family in room for support.  Objective: BP 132/70   Pulse 90   Temp 98.3 F (36.8 C) (Oral)   Ht 5\' 2"  (1.575 m)   Wt 188 lb (85.3 kg)   LMP 10/19/2015 (Exact Date)   BMI 34.39 kg/m  No intake/output data recorded. No intake/output data recorded.  FHT:  FHR: 135 bpm, variability: moderate with periods of minimal,  accelerations:  Present,  decelerations:  Absent UC:   irregular SVE:   Dilation: 1 Effacement (%): 60 Station: -2 Exam by:: Leftwitchkirby, CNM Foley Bulb placed without difficulty. Pt tolerated well.    Labs: Lab Results  Component Value Date   WBC 8.5 08/06/2016   HGB 10.1 (L) 08/06/2016   HCT 32.3 (L) 08/06/2016   MCV 80.8 08/06/2016   PLT 292 08/06/2016    Assessment / Plan: Induction of labor due to hyperthyroid,  progressing well on pitocin  Labor: hyperthyroid Preeclampsia:  n/a Fetal Wellbeing:  Category I Pain Control:  Labor support without medications I/D:  n/a Anticipated MOD:  NSVD  Sharen CounterLisa Leftwich-Kirby 08/06/2016, 3:27 PM

## 2016-08-06 NOTE — Anesthesia Preprocedure Evaluation (Signed)

## 2016-08-06 NOTE — Progress Notes (Signed)
Labor Progress Note  Taylor Gonzalez is a 21 y.o. G2P1001 at 3061w3d  admitted for IOL for hyperthyroidism. Patient comfortable currently.  FHT:  130 bmp, mod var, accels, decels UC:   q2-6750m  SVE:   Dilation: 4 Effacement (%): 60 Station: -3 Exam by:: Dorathy DaftShay Payne RN   Labs: Lab Results  Component Value Date   WBC 8.5 08/06/2016   HGB 10.1 (L) 08/06/2016   HCT 32.3 (L) 08/06/2016   MCV 80.8 08/06/2016   PLT 292 08/06/2016    Assessment / Plan: Labor: s/p cytotec x3, foley bulb out and IUPC/AROM at 1300. Contracting well on her own, add pit as needed Fetal Wellbeing:  Category I Pain Control:  Epidural Anticipated MOD:  NSVD  Expectant management  Howard PouchLauren Ignazio Kincaid, MD PGY-2 [facility]

## 2016-08-06 NOTE — Anesthesia Pain Management Evaluation Note (Signed)
  CRNA Pain Management Visit Note  Patient: Trenda MootsVictoria Dominque Emmanuel Snavely, 21 y.o., female  "Hello I am a member of the anesthesia team at [facility]. We have an anesthesia team available at all times to provide care throughout the hospital, including epidural management and anesthesia for C-section. I don't know your plan for the delivery whether it a natural birth, water birth, IV sedation, nitrous supplementation, doula or epidural, but we want to meet your pain goals."   1.Was your pain managed to your expectations on prior hospitalizations?   Yes   2.What is your expectation for pain management during this hospitalization?     Epidural  3.How can we help you reach that goal? Epidural when appropriate.  Record the patient's initial score and the patient's pain goal.   Pain: 1  Pain Goal: 4 [facility] wants you to be able to say your pain was always managed very well.  Amay Mijangos 08/06/2016

## 2016-08-06 NOTE — Anesthesia Procedure Notes (Signed)
Epidural Patient location during procedure: OB  Staffing Anesthesiologist: Jaxxon Naeem Performed: anesthesiologist   Preanesthetic Checklist Completed: patient identified, site marked, surgical consent, pre-op evaluation, timeout performed, IV checked, risks and benefits discussed and monitors and equipment checked  Epidural Patient position: sitting Prep: DuraPrep Patient monitoring: heart rate, continuous pulse ox and blood pressure Approach: right paramedian Location: L3-L4 Injection technique: LOR saline  Needle:  Needle type: Tuohy  Needle gauge: 17 G Needle length: 9 cm and 9 Needle insertion depth: 7 cm Catheter type: closed end flexible Catheter size: 20 Guage Catheter at skin depth: 12 cm Test dose: negative  Assessment Events: blood not aspirated, injection not painful, no injection resistance, negative IV test and no paresthesia  Additional Notes Patient identified. Risks/Benefits/Options discussed with patient including but not limited to bleeding, infection, nerve damage, paralysis, failed block, incomplete pain control, headache, blood pressure changes, nausea, vomiting, reactions to medication both or allergic, itching and postpartum back pain. Confirmed with bedside nurse the patient's most recent platelet count. Confirmed with patient that they are not currently taking any anticoagulation, have any bleeding history or any family history of bleeding disorders. Patient expressed understanding and wished to proceed. All questions were answered. Sterile technique was used throughout the entire procedure. Please see nursing notes for vital signs. Test dose was given through epidural needle and negative prior to continuing to dose epidural or start infusion. Warning signs of high block given to the patient including shortness of breath, tingling/numbness in hands, complete motor block, or any concerning symptoms with instructions to call for help. Patient was given  instructions on fall risk and not to get out of bed. All questions and concerns addressed with instructions to call with any issues.     

## 2016-08-07 ENCOUNTER — Encounter (HOSPITAL_COMMUNITY): Payer: Self-pay

## 2016-08-07 DIAGNOSIS — E0590 Thyrotoxicosis, unspecified without thyrotoxic crisis or storm: Secondary | ICD-10-CM

## 2016-08-07 DIAGNOSIS — O99284 Endocrine, nutritional and metabolic diseases complicating childbirth: Secondary | ICD-10-CM

## 2016-08-07 DIAGNOSIS — Z3A39 39 weeks gestation of pregnancy: Secondary | ICD-10-CM

## 2016-08-07 MED ORDER — COCONUT OIL OIL: 1.0000 "application " | TOPICAL_OIL | Status: DC | PRN

## 2016-08-07 MED ORDER — SENNOSIDES-DOCUSATE SODIUM 8.6-50 MG PO TABS
2.0000 | ORAL_TABLET | ORAL | Status: DC
  Administered 2016-08-07 – 2016-08-08 (×2): 2 via ORAL
  Filled 2016-08-07 (×2): qty 2

## 2016-08-07 MED ORDER — DIPHENHYDRAMINE HCL 25 MG PO CAPS: 25.0000 mg | ORAL_CAPSULE | Freq: Four times a day (QID) | ORAL | Status: DC | PRN

## 2016-08-07 MED ORDER — ONDANSETRON HCL 4 MG/2ML IJ SOLN: 4.0000 mg | INTRAMUSCULAR | Status: DC | PRN

## 2016-08-07 MED ORDER — ZOLPIDEM TARTRATE 5 MG PO TABS: 5.0000 mg | ORAL_TABLET | Freq: Every evening | ORAL | Status: DC | PRN

## 2016-08-07 MED ORDER — DIBUCAINE 1 % RE OINT: 1.0000 "application " | TOPICAL_OINTMENT | RECTAL | Status: DC | PRN

## 2016-08-07 MED ORDER — ONDANSETRON HCL 4 MG PO TABS
4.0000 mg | ORAL_TABLET | ORAL | Status: DC | PRN
Start: 2016-08-07 — End: 2016-08-09

## 2016-08-07 MED ORDER — ACETAMINOPHEN 325 MG PO TABS
650.0000 mg | ORAL_TABLET | ORAL | Status: DC | PRN
  Administered 2016-08-07: 650 mg via ORAL
  Filled 2016-08-07: qty 2

## 2016-08-07 MED ORDER — TETANUS-DIPHTH-ACELL PERTUSSIS 5-2.5-18.5 LF-MCG/0.5 IM SUSP: 0.5000 mL | Freq: Once | INTRAMUSCULAR | Status: DC

## 2016-08-07 MED ORDER — SIMETHICONE 80 MG PO CHEW: 80.0000 mg | CHEWABLE_TABLET | ORAL | Status: DC | PRN

## 2016-08-07 MED ORDER — WITCH HAZEL-GLYCERIN EX PADS: 1.0000 "application " | MEDICATED_PAD | CUTANEOUS | Status: DC | PRN

## 2016-08-07 MED ORDER — PRENATAL MULTIVITAMIN CH
1.0000 | ORAL_TABLET | Freq: Every day | ORAL | Status: DC
  Administered 2016-08-08: 1 via ORAL
  Filled 2016-08-07: qty 1

## 2016-08-07 MED ORDER — IBUPROFEN 600 MG PO TABS
600.0000 mg | ORAL_TABLET | Freq: Four times a day (QID) | ORAL | Status: DC
  Administered 2016-08-07 – 2016-08-09 (×8): 600 mg via ORAL
  Filled 2016-08-07 (×8): qty 1

## 2016-08-07 MED ORDER — BENZOCAINE-MENTHOL 20-0.5 % EX AERO
1.0000 "application " | INHALATION_SPRAY | CUTANEOUS | Status: DC | PRN
  Filled 2016-08-07: qty 56

## 2016-08-07 NOTE — Progress Notes (Signed)
Labor Progress Note  S: G2P1001 at 6475w3d admitted for IOL for hyperthyroidism. Patient resting comfortably in bed after epidural placement.  O:  BP 126/80   Pulse 91   Temp 98.3 F (36.8 C) (Oral)   Resp 18   Ht 5\' 2"  (1.575 m)   Wt 85.3 kg (188 lb)   LMP 10/19/2015 (Exact Date)   SpO2 100%   BMI 34.39 kg/m    Cat 1. Baseline 140, moderate variability, accels present, early decels present.  CVE: Dilation: 5 Effacement (%): 60, 70 Cervical Position: Posterior Station: -3 Presentation: Vertex Exam by:: Dorathy DaftShay Payne RN    A&P: 21 y.o. G2P1001 5243w4d  S/p cytotec x3, Foley bulb out and IUPC/AROM at 1300 Continue Pit  Continue expectant management Anticipate SVD Patient and FOB requesting 15 minute delayed cord clamping and no vaccines.   Jeanie CooksSarah Abdalrahman Clementson, Medical Student 4:45 AM

## 2016-08-07 NOTE — Anesthesia Postprocedure Evaluation (Signed)
Anesthesia Post Note  Patient: Taylor Gonzalez  Procedure(s) Performed: * No procedures listed *     Patient location during evaluation: Mother Baby Anesthesia Type: Epidural Level of consciousness: awake, awake and alert, oriented and patient cooperative Pain management: pain level controlled Vital Signs Assessment: post-procedure vital signs reviewed and stable Respiratory status: spontaneous breathing, nonlabored ventilation and respiratory function stable Cardiovascular status: stable Postop Assessment: no headache, no backache, no signs of nausea or vomiting and patient able to bend at knees Anesthetic complications: no Comments: Patient did report that epidural placement hurt, which was different from her previous epidural placement.    Last Vitals:  Vitals:   08/07/16 1138 08/07/16 1238  BP: 140/73 136/68  Pulse: 68 80  Resp: 18 18  Temp: 37.5 C 36.8 C    Last Pain:  Vitals:   08/07/16 1238  TempSrc: Oral  PainSc:    Pain Goal:                 Quest Tavenner L

## 2016-08-08 NOTE — Progress Notes (Signed)
Post Partum Day 1 Subjective: no complaints, up ad lib, voiding and tolerating PO  Objective: Blood pressure 119/76, pulse 75, temperature 98.1 F (36.7 C), temperature source Oral, resp. rate 16, height 5\' 2"  (1.575 m), weight 188 lb (85.3 kg), last menstrual period 10/19/2015, SpO2 99 %, unknown if currently breastfeeding.  Physical Exam:  General: alert, cooperative and no distress Lochia: appropriate Uterine Fundus: firm Incision: healing well DVT Evaluation: No evidence of DVT seen on physical exam.   Recent Labs  08/06/16 0800  HGB 10.1*  HCT 32.3*    Assessment/Plan: Plan for discharge tomorrow and Breastfeeding   LOS: 2 days   Taylor BourgeoisMarie Archit Gonzalez 08/08/2016, 9:13 AM

## 2016-08-08 NOTE — Lactation Note (Signed)
This note was copied from a baby's chart. Lactation Consultation Note  Patient Name: Girl Desma PaganiniVictoria Bergum Today's Date: 08/08/2016 Reason for consult: Initial assessment Breastfeeding consultation services and support information given and reviewed.  Mom states baby latches easily and feeds well.  Instructed to feed with any feeding cue and to call for concerns/assist prn.  Maternal Data    Feeding Feeding Type: Breast Fed Length of feed: 60 min  LATCH Score/Interventions                      Lactation Tools Discussed/Used     Consult Status Consult Status: Follow-up Date: 08/09/16 Follow-up type: In-patient    Huston FoleyMOULDEN, Kalyb Pemble S 08/08/2016, 9:39 AM

## 2016-08-08 NOTE — Progress Notes (Signed)
CSW met with MOB at bedside to complete assessment regarding consult stating " FOB very difficult in L&D. Did not allow mother to speak re: shots for infant. Per maternal grandmother... FOB takes their other child without asking mother for months at a time. Please try to see MOB while alone in her room".   Upon this writers arrival, MOB was alone in the room tending to baby. This writer explained role and reasoning for visit. MOB was warm and welcoming but expressed confusion for stated reasoning for visit. This writer inquired about L&D concerns re: FOB and allegations her mother expressed re: FOB taking older child without asking. MOB was notably confused noting none of that is true. MOB further notes she and FOB parent collectively; thus, he is allowed to have his opinions and/or concerns regarding his baby's care to include shots/vaccines. MOB notes additionally, the information her mother provided is not accurate as anytime he has taken their older child for months was per MOB's request as their was a time she was unemployed and could not care for her primarily on her own; thus, she asked FOB to assist. This writer apologized for any confusion this writers visit may have caused. MOB was thankful and noted she is not upset and she understands people pass judgement on others often so it is not surprising. MOB further notes, their are no psychosocial concerns at this time; however, is appreciative this writer came to assess and clarify since what as perceived was not the case.   Bexton Haak, MSW, LCSW-A Clinical Social Worker  [facility]  Office: [phone]  

## 2016-08-09 ENCOUNTER — Other Ambulatory Visit: Payer: Self-pay | Admitting: Family Medicine

## 2016-08-09 MED ORDER — IBUPROFEN 600 MG PO TABS
600.0000 mg | ORAL_TABLET | Freq: Four times a day (QID) | ORAL | 0 refills | Status: DC | PRN
Start: 2016-08-09 — End: 2018-02-13

## 2016-08-09 NOTE — Progress Notes (Signed)
Post discharge chart review completed.  

## 2016-08-09 NOTE — Discharge Summary (Signed)
OB Discharge Summary     Patient Name: Taylor Gonzalez DOB: 1995-09-25 MRN: [id number]  Date of admission: 08/06/2016 Delivering MD: Gailen Shelter   Date of discharge: 08/09/2016  Admitting diagnosis: INDUCTION Intrauterine pregnancy: [redacted]w[redacted]d    Secondary diagnosis:  Active Problems:   Hyperthyroidism affecting pregnancy in third trimester   SVD (spontaneous vaginal delivery)  Additional problems: GBS pos     Discharge diagnosis: Term Pregnancy Delivered                                                                                                Post partum procedures:none  Augmentation: AROM, Pitocin, Cytotec and Foley Balloon  Complications: None  Hospital course:  Induction of Labor With Vaginal Delivery   21y.o. yo [id number] admitted to the hospital 08/06/2016 for induction of labor.  Indication for induction: hyperthyroidism.  Patient had an uncomplicated labor course as follows: Membrane Rupture Time/Date: 6:00 AM ,08/07/2016   Intrapartum Procedures: Episiotomy: None [1]                                         Lacerations:  None [1]  Patient had delivery of a Viable infant.  Information for the patient's newborn:  KiTeigen, Parslow0[[id number]]Delivery Method: Vaginal, Spontaneous Delivery (Filed from Delivery Summary)   08/07/2016  Details of delivery can be found in separate delivery note.  Patient had a routine postpartum course. Patient is discharged home 08/09/16. Pt had stopped taking PTU in the pregnancy. She met with a SW while inpt and it doesn't appear that financial issues were brought up concerning the medication. Will follow PP.  Physical exam  Vitals:   08/07/16 1631 08/08/16 0520 08/08/16 1857 08/09/16 0500  BP: 123/68 119/76 129/73 132/83  Pulse: 80 75 66 77  Resp:  16 18   Temp: 98.8 F (37.1 C) 98.1 F (36.7 C) 98.7 F (37.1 C) 98.1 F (36.7 C)  TempSrc: Oral Oral Oral Oral  SpO2:    100%  Weight:       Height:       General: alert and cooperative Lochia: appropriate Uterine Fundus: firm Incision: N/A DVT Evaluation: No evidence of DVT seen on physical exam. Labs: Lab Results  Component Value Date   WBC 8.5 08/06/2016   HGB 10.1 (L) 08/06/2016   HCT 32.3 (L) 08/06/2016   MCV 80.8 08/06/2016   PLT 292 08/06/2016   No flowsheet data found.  Discharge instruction: per After Visit Summary and "Baby and Me Booklet".  After visit meds:  Allergies as of 08/09/2016   No Known Allergies     Medication List    TAKE these medications   ibuprofen 600 MG tablet Commonly known as:  ADVIL,MOTRIN Take 1 tablet (600 mg total) by mouth every 6 (six) hours as needed.       Diet: routine diet  Activity: Advance as tolerated. Pelvic rest for 6 weeks.   Outpatient follow up:6 weeks-  will need f/u re hyperthyroid Follow up Appt:Future Appointments Date Time Provider [place]  09/09/2016 2:00 PM Danielle Rankin [facility]   Follow up Visit:No Follow-up on file.  Postpartum contraception: Undecided  Newborn Data: Live born female  Birth Weight: 7 lb 3.7 oz (3280 g) APGAR: 7, 9  Baby Feeding: Breast Disposition:home with mother   08/09/2016 Serita Grammes, CNM 8:24 AM

## 2016-08-09 NOTE — Lactation Note (Signed)
This note was copied from a baby's chart. Lactation Consultation Note  Patient Name: Girl Desma PaganiniVictoria Mo WUJWJ'XToday's Date: 08/09/2016 Reason for consult: Follow-up assessment Baby at 50 hr of life and dyad set for d/c today. Mom denies breast or nipple pain, voiced no concerns. Dad had a lot of questions about maternal diet and ways that she can keep her supply up. Dad stated they are "all natural and she does not take any pills including prenatal vitamins". Discussed baby behavior, feeding frequency, pumping, milk handling, baby belly size, voids, wt loss, breast changes, and nipple care. Parents are aware of lactation services and support group. They will call as needed.    Maternal Data    Feeding Feeding Type: Breast Fed Length of feed: 15 min  LATCH Score/Interventions                      Lactation Tools Discussed/Used     Consult Status Consult Status: Complete Follow-up type: Call as needed    Rulon Eisenmengerlizabeth E Lynlee Stratton 08/09/2016, 11:09 AM

## 2016-08-09 NOTE — Discharge Instructions (Signed)

## 2016-08-11 ENCOUNTER — Encounter: Payer: Self-pay | Admitting: General Practice

## 2016-08-12 ENCOUNTER — Other Ambulatory Visit: Payer: Self-pay

## 2016-09-09 ENCOUNTER — Ambulatory Visit: Payer: Self-pay | Admitting: Medical

## 2016-09-09 ENCOUNTER — Encounter: Payer: Self-pay | Admitting: Medical

## 2018-02-13 ENCOUNTER — Other Ambulatory Visit: Payer: Self-pay

## 2018-02-13 ENCOUNTER — Encounter (HOSPITAL_COMMUNITY): Payer: Self-pay | Admitting: *Deleted

## 2018-02-13 ENCOUNTER — Inpatient Hospital Stay (HOSPITAL_BASED_OUTPATIENT_CLINIC_OR_DEPARTMENT_OTHER): Payer: Medicaid Other

## 2018-02-13 ENCOUNTER — Inpatient Hospital Stay (HOSPITAL_COMMUNITY)
Admission: AD | Admit: 2018-02-13 | Discharge: 2018-02-13 | Disposition: A | Discharge status: Home / Self Care | Payer: Medicaid Other | Attending: Obstetrics and Gynecology | Admitting: Obstetrics and Gynecology

## 2018-02-13 DIAGNOSIS — Z3A30 30 weeks gestation of pregnancy: Secondary | ICD-10-CM | POA: Diagnosis not present

## 2018-02-13 DIAGNOSIS — R109 Unspecified abdominal pain: Secondary | ICD-10-CM | POA: Diagnosis present

## 2018-02-13 DIAGNOSIS — Z3687 Encounter for antenatal screening for uncertain dates: Secondary | ICD-10-CM | POA: Diagnosis not present

## 2018-02-13 DIAGNOSIS — Z3A00 Weeks of gestation of pregnancy not specified: Secondary | ICD-10-CM | POA: Diagnosis not present

## 2018-02-13 DIAGNOSIS — O26893 Other specified pregnancy related conditions, third trimester: Secondary | ICD-10-CM | POA: Diagnosis not present

## 2018-02-13 DIAGNOSIS — R8271 Bacteriuria: Secondary | ICD-10-CM | POA: Diagnosis not present

## 2018-02-13 DIAGNOSIS — O0933 Supervision of pregnancy with insufficient antenatal care, third trimester: Secondary | ICD-10-CM | POA: Diagnosis not present

## 2018-02-13 DIAGNOSIS — O26899 Other specified pregnancy related conditions, unspecified trimester: Secondary | ICD-10-CM

## 2018-02-13 HISTORY — DX: Unspecified infectious disease: B99.9

## 2018-02-13 LAB — URINALYSIS, ROUTINE W REFLEX MICROSCOPIC
BILIRUBIN URINE: NEGATIVE
Glucose, UA: NEGATIVE mg/dL
HGB URINE DIPSTICK: NEGATIVE
Ketones, ur: 20 mg/dL — AB
NITRITE: NEGATIVE
Protein, ur: 100 mg/dL — AB
SPECIFIC GRAVITY, URINE: 1.015 (ref 1.005–1.030)
pH: 6 (ref 5.0–8.0)

## 2018-02-13 LAB — WET PREP, GENITAL
Clue Cells Wet Prep HPF POC: NONE SEEN
SPERM: NONE SEEN
Trich, Wet Prep: NONE SEEN
YEAST WET PREP: NONE SEEN

## 2018-02-13 MED ORDER — CEPHALEXIN 500 MG PO CAPS: 500.0000 mg | ORAL_CAPSULE | Freq: Three times a day (TID) | ORAL | 0 refills | Status: DC

## 2018-02-13 NOTE — Discharge Instructions (Signed)
Abdominal Pain, Adult  Many things can cause belly (abdominal) pain. Most times, belly pain is not dangerous. Many cases of belly pain can be watched and treated at home. Sometimes belly pain is serious, though. Your doctor will try to find the cause of your belly pain. Follow these instructions at home:  Take over-the-counter and prescription medicines only as told by your doctor. Do not take medicines that help you poop (laxatives) unless told to by your doctor.  Drink enough fluid to keep your pee (urine) clear or pale yellow.  Watch your belly pain for any changes.  Keep all follow-up visits as told by your doctor. This is important. Contact a doctor if:  Your belly pain changes or gets worse.  You are not hungry, or you lose weight without trying.  You are having trouble pooping (constipated) or have watery poop (diarrhea) for more than 2-3 days.  You have pain when you pee or poop.  Your belly pain wakes you up at night.  Your pain gets worse with meals, after eating, or with certain foods.  You are throwing up and cannot keep anything down.  You have a fever. Get help right away if:  Your pain does not go away as soon as your doctor says it should.  You cannot stop throwing up.  Your pain is only in areas of your belly, such as the right side or the left lower part of the belly.  You have bloody or black poop, or poop that looks like tar.  You have very bad pain, cramping, or bloating in your belly.  You have signs of not having enough fluid or water in your body (dehydration), such as: ? Dark pee, very little pee, or no pee. ? Cracked lips. ? Dry mouth. ? Sunken eyes. ? Sleepiness. ? Weakness. This information is not intended to replace advice given to you by your health care provider. Make sure you discuss any questions you have with your health care provider. Document Released: 06/30/2007 Document Revised: 08/01/2015 Document Reviewed: 06/25/2015 Elsevier  Interactive Patient Education  2019 [employer].   Third Trimester of Pregnancy The third trimester is from week 28 through week 40 (months 7 through 9). The third trimester is a time when the unborn baby (fetus) is growing rapidly. At the end of the ninth month, the fetus is about 20 inches in length and weighs 6-10 pounds. Body changes during your third trimester Your body will continue to go through many changes during pregnancy. The changes vary from woman to woman. During the third trimester:  Your weight will continue to increase. You can expect to gain 25-35 pounds (11-16 kg) by the end of the pregnancy.  You may begin to get stretch marks on your hips, abdomen, and breasts.  You may urinate more often because the fetus is moving lower into your pelvis and pressing on your bladder.  You may develop or continue to have heartburn. This is caused by increased hormones that slow down muscles in the digestive tract.  You may develop or continue to have constipation because increased hormones slow digestion and cause the muscles that push waste through your intestines to relax.  You may develop hemorrhoids. These are swollen veins (varicose veins) in the rectum that can itch or be painful.  You may develop swollen, bulging veins (varicose veins) in your legs.  You may have increased body aches in the pelvis, back, or thighs. This is due to weight gain and increased hormones  that are relaxing your joints.  You may have changes in your hair. These can include thickening of your hair, rapid growth, and changes in texture. Some women also have hair loss during or after pregnancy, or hair that feels dry or thin. Your hair will most likely return to normal after your baby is born.  Your breasts will continue to grow and they will continue to become tender. A yellow fluid (colostrum) may leak from your breasts. This is the first milk you are producing for your baby.  Your belly button may  stick out.  You may notice more swelling in your hands, face, or ankles.  You may have increased tingling or numbness in your hands, arms, and legs. The skin on your belly may also feel numb.  You may feel short of breath because of your expanding uterus.  You may have more problems sleeping. This can be caused by the size of your belly, increased need to urinate, and an increase in your body's metabolism.  You may notice the fetus "dropping," or moving lower in your abdomen (lightening).  You may have increased vaginal discharge.  You may notice your joints feel loose and you may have pain around your pelvic bone. What to expect at prenatal visits You will have prenatal exams every 2 weeks until week 36. Then you will have weekly prenatal exams. During a routine prenatal visit:  You will be weighed to make sure you and the baby are growing normally.  Your blood pressure will be taken.  Your abdomen will be measured to track your baby's growth.  The fetal heartbeat will be listened to.  Any test results from the previous visit will be discussed.  You may have a cervical check near your due date to see if your cervix has softened or thinned (effaced).  You will be tested for Group B streptococcus. This happens between 35 and 37 weeks. Your health care provider may ask you:  What your birth plan is.  How you are feeling.  If you are feeling the baby move.  If you have had any abnormal symptoms, such as leaking fluid, bleeding, severe headaches, or abdominal cramping.  If you are using any tobacco products, including cigarettes, chewing tobacco, and electronic cigarettes.  If you have any questions. Other tests or screenings that may be performed during your third trimester include:  Blood tests that check for low iron levels (anemia).  Fetal testing to check the health, activity level, and growth of the fetus. Testing is done if you have certain medical conditions or if  there are problems during the pregnancy.  Nonstress test (NST). This test checks the health of your baby to make sure there are no signs of problems, such as the baby not getting enough oxygen. During this test, a belt is placed around your belly. The baby is made to move, and its heart rate is monitored during movement. What is false labor? False labor is a condition in which you feel small, irregular tightenings of the muscles in the womb (contractions) that usually go away with rest, changing position, or drinking water. These are called Braxton Hicks contractions. Contractions may last for hours, days, or even weeks before true labor sets in. If contractions come at regular intervals, become more frequent, increase in intensity, or become painful, you should see your health care provider. What are the signs of labor?  Abdominal cramps.  Regular contractions that start at 10 minutes apart and become stronger and  more frequent with time.  Contractions that start on the top of the uterus and spread down to the lower abdomen and back.  Increased pelvic pressure and dull back pain.  A watery or bloody mucus discharge that comes from the vagina.  Leaking of amniotic fluid. This is also known as your "water breaking." It could be a slow trickle or a gush. Let your health care provider know if it has a color or strange odor. If you have any of these signs, call your health care provider right away, even if it is before your due date. Follow these instructions at home: Medicines  Follow your health care provider's instructions regarding medicine use. Specific medicines may be either safe or unsafe to take during pregnancy.  Take a prenatal vitamin that contains at least 600 micrograms (mcg) of folic acid.  If you develop constipation, try taking a stool softener if your health care provider approves. Eating and drinking   Eat a balanced diet that includes fresh fruits and vegetables, whole  grains, good sources of protein such as meat, eggs, or tofu, and low-fat dairy. Your health care provider will help you determine the amount of weight gain that is right for you.  Avoid raw meat and uncooked cheese. These carry germs that can cause birth defects in the baby.  If you have low calcium intake from food, talk to your health care provider about whether you should take a daily calcium supplement.  Eat four or five small meals rather than three large meals a day.  Limit foods that are high in fat and processed sugars, such as fried and sweet foods.  To prevent constipation: ? Drink enough fluid to keep your urine clear or pale yellow. ? Eat foods that are high in fiber, such as fresh fruits and vegetables, whole grains, and beans. Activity  Exercise only as directed by your health care provider. Most women can continue their usual exercise routine during pregnancy. Try to exercise for 30 minutes at least 5 days a week. Stop exercising if you experience uterine contractions.  Avoid heavy lifting.  Do not exercise in extreme heat or humidity, or at high altitudes.  Wear low-heel, comfortable shoes.  Practice good posture.  You may continue to have sex unless your health care provider tells you otherwise. Relieving pain and discomfort  Take frequent breaks and rest with your legs elevated if you have leg cramps or low back pain.  Take warm sitz baths to soothe any pain or discomfort caused by hemorrhoids. Use hemorrhoid cream if your health care provider approves.  Wear a good support bra to prevent discomfort from breast tenderness.  If you develop varicose veins: ? Wear support pantyhose or compression stockings as told by your healthcare provider. ? Elevate your feet for 15 minutes, 3-4 times a day. Prenatal care  Write down your questions. Take them to your prenatal visits.  Keep all your prenatal visits as told by your health care provider. This is  important. Safety  Wear your seat belt at all times when driving.  Make a list of emergency phone numbers, including numbers for family, friends, the hospital, and police and fire departments. General instructions  Avoid cat litter boxes and soil used by cats. These carry germs that can cause birth defects in the baby. If you have a cat, ask someone to clean the litter box for you.  Do not travel far distances unless it is absolutely necessary and only with the approval of  your health care provider.  Do not use hot tubs, steam rooms, or saunas.  Do not drink alcohol.  Do not use any products that contain nicotine or tobacco, such as cigarettes and e-cigarettes. If you need help quitting, ask your health care provider.  Do not use any medicinal herbs or unprescribed drugs. These chemicals affect the formation and growth of the baby.  Do not douche or use tampons or scented sanitary pads.  Do not cross your legs for long periods of time.  To prepare for the arrival of your baby: ? Take prenatal classes to understand, practice, and ask questions about labor and delivery. ? Make a trial run to the hospital. ? Visit the hospital and tour the maternity area. ? Arrange for maternity or paternity leave through employers. ? Arrange for family and friends to take care of pets while you are in the hospital. ? Purchase a rear-facing car seat and make sure you know how to install it in your car. ? Pack your hospital bag. ? Prepare the babys nursery. Make sure to remove all pillows and stuffed animals from the baby's crib to prevent suffocation.  Visit your dentist if you have not gone during your pregnancy. Use a soft toothbrush to brush your teeth and be gentle when you floss. Contact a health care provider if:  You are unsure if you are in labor or if your water has broken.  You become dizzy.  You have mild pelvic cramps, pelvic pressure, or nagging pain in your abdominal area.  You  have lower back pain.  You have persistent nausea, vomiting, or diarrhea.  You have an unusual or bad smelling vaginal discharge.  You have pain when you urinate. Get help right away if:  Your water breaks before 37 weeks.  You have regular contractions less than 5 minutes apart before 37 weeks.  You have a fever.  You are leaking fluid from your vagina.  You have spotting or bleeding from your vagina.  You have severe abdominal pain or cramping.  You have rapid weight loss or weight gain.  You have shortness of breath with chest pain.  You notice sudden or extreme swelling of your face, hands, ankles, feet, or legs.  Your baby makes fewer than 10 movements in 2 hours.  You have severe headaches that do not go away when you take medicine.  You have vision changes. Summary  The third trimester is from week 28 through week 40, months 7 through 9. The third trimester is a time when the unborn baby (fetus) is growing rapidly.  During the third trimester, your discomfort may increase as you and your baby continue to gain weight. You may have abdominal, leg, and back pain, sleeping problems, and an increased need to urinate.  During the third trimester your breasts will keep growing and they will continue to become tender. A yellow fluid (colostrum) may leak from your breasts. This is the first milk you are producing for your baby.  False labor is a condition in which you feel small, irregular tightenings of the muscles in the womb (contractions) that eventually go away. These are called Braxton Hicks contractions. Contractions may last for hours, days, or even weeks before true labor sets in.  Signs of labor can include: abdominal cramps; regular contractions that start at 10 minutes apart and become stronger and more frequent with time; watery or bloody mucus discharge that comes from the vagina; increased pelvic pressure and dull back pain; and  leaking of amniotic fluid. This  information is not intended to replace advice given to you by your health care provider. Make sure you discuss any questions you have with your health care provider. Document Released: 01/05/2001 Document Revised: 02/17/2016 Document Reviewed: 02/17/2016 Elsevier Interactive Patient Education  2019 [employer].

## 2018-02-13 NOTE — MAU Provider Note (Signed)
History     CSN: [id number]  Arrival date and time: 02/13/18 1443   First Provider Initiated Contact with Patient 02/13/18 1557      Chief Complaint  Patient presents with  . Abdominal Pain   [facility] Gillianne Wegrzyn is a 23 y.o. G3P2002 at [redacted]w[redacted]d who presents for Abdominal Pain.  Patient states the pain started yesterday and is intermittent, but is a 5/10 when present.  Patient states the pain "feels like a cramp."  Patient states she has not taken any medication for the pain and it has no aggravating or alleviating factors.  She endorses fetal movement and denies VB, LoF, or cramping/contractions. She denies vaginal discharge or problems with urination or bowel movements. She denies N/V.      OB History    Gravida  3   Para  2   Term  2   Preterm  0   AB  0   Living  2     SAB  0   TAB  0   Ectopic  0   Multiple  0   Live Births  2           Past Medical History:  Diagnosis Date  . Hyperthyroidism   . Hyperthyroidism    no meds now  . Infection    UTI  . Thyroid disease     Past Surgical History:  Procedure Laterality Date  . NO PAST SURGERIES      Family History  Problem Relation Age of Onset  . Diabetes Paternal Grandmother   . Hypertension Paternal Grandmother   . Diabetes Maternal Grandmother   . Diabetes Maternal Grandfather   . Hypertension Maternal Grandfather   . Epilepsy Mother     Social History   Tobacco Use  . Smoking status: Never Smoker  . Smokeless tobacco: Never Used  Substance Use Topics  . Alcohol use: No  . Drug use: No    Allergies: No Known Allergies  Medications Prior to Admission  Medication Sig Dispense Refill Last Dose  . Prenatal Vit-Fe Fumarate-FA (PRENATAL MULTIVITAMIN) TABS tablet Take 1 tablet by mouth daily at 12 noon.   02/13/2018 at Unknown time    Review of Systems  Constitutional: Negative for chills and fever.  Gastrointestinal: Negative for nausea and vomiting.  Genitourinary:  Negative for difficulty urinating.  Neurological: Negative for light-headedness and headaches.   Physical Exam   Blood pressure 127/73, pulse (!) 121, temperature 99.1 F (37.3 C), temperature source Oral, resp. rate 18, weight 92.3 kg, last menstrual period 07/18/2017, SpO2 100 %, unknown if currently breastfeeding. Results for orders placed or performed during the hospital encounter of 02/13/18 (from the past 24 hour(s))  Urinalysis, Routine w reflex microscopic     Status: Abnormal   Collection Time: 02/13/18  3:27 PM  Result Value Ref Range   Color, Urine YELLOW YELLOW   APPearance CLOUDY (A) CLEAR   Specific Gravity, Urine 1.015 1.005 - 1.030   pH 6.0 5.0 - 8.0   Glucose, UA NEGATIVE NEGATIVE mg/dL   Hgb urine dipstick NEGATIVE NEGATIVE   Bilirubin Urine NEGATIVE NEGATIVE   Ketones, ur 20 (A) NEGATIVE mg/dL   Protein, ur 428 (A) NEGATIVE mg/dL   Nitrite NEGATIVE NEGATIVE   Leukocytes, UA SMALL (A) NEGATIVE   RBC / HPF 11-20 0 - 5 RBC/hpf   WBC, UA 21-50 0 - 5 WBC/hpf   Bacteria, UA MANY (A) NONE SEEN   Squamous Epithelial / LPF 6-10 0 -  5   Mucus PRESENT   Wet prep, genital     Status: Abnormal   Collection Time: 02/13/18  4:12 PM  Result Value Ref Range   Yeast Wet Prep HPF POC NONE SEEN NONE SEEN   Trich, Wet Prep NONE SEEN NONE SEEN   Clue Cells Wet Prep HPF POC NONE SEEN NONE SEEN   WBC, Wet Prep HPF POC MODERATE (A) NONE SEEN   Sperm NONE SEEN     Physical Exam  Constitutional: She is oriented to person, place, and time. She appears well-developed and well-nourished.  HENT:  Head: Normocephalic and atraumatic.  Eyes: Conjunctivae are normal.  Neck: Normal range of motion.  Cardiovascular: Normal rate, regular rhythm and normal heart sounds.  Respiratory: Effort normal and breath sounds normal.  GI: Soft.  Genitourinary:    Vaginal discharge present.     No vaginal bleeding.  No bleeding in the vagina.    Genitourinary Comments:  Speculum Exam: -Vaginal  Vault: Copious amt of thick white discharge in vault -wet prep and gc/ct collected -Cervix:Unable to visualize due to posterior position.  -Bimanual Exam: Closed/Long/Thick/Ballotable   Musculoskeletal: Normal range of motion.  Neurological: She is alert and oriented to person, place, and time.  Skin: Skin is warm and dry.  Psychiatric: She has a normal mood and affect. Her behavior is normal.   Fetal Assessment 135 bpm, Mod Var, -Decels, +Accels Toco: None graphed  MAU Course  Procedures US Findings  Fetal Evaluation  Num Of Fetuses:         1  Fetal Heart Rate(bpm):  134  Cardiac Activity:       Observed  Presentation:           Cephalic  Placenta:               Posterior  P. Cord Insertion:      Visualized  Amniotic Fluid  AFI FV:      Within normal limits  AFI Sum(cm)                 Largest Pocket(cm)  15.95                       5.83  RUQ(cm)       RLQ(cm)       LUQ(cm)        LLQ(cm)  5.83          2.28          4.15           3.69  Comment:    No placental abruption or previa identified. ---------------------------------------------------------------------- Biometry  BPD:      76.7  mm     G. Age:  30w 5d ---------------------------------------------------------------------- OB History  Gravidity:    3         Term:   2        Prem:   0        SAB:   0  TOP:          0       Ectopic:  0        Living: 2 ---------------------------------------------------------------------- Gestational Age  LMP:           30w 0d        Date:  07/18/17                 EDD:   04/24/18  U/S Today:  30w 5d                                        EDD:   04/19/18 ---------------------------------------------------------------------- Anatomy  Stomach: Appears normal, left   Bladder: Appears normal sided ---------------------------------------------------------------------- Cervix Uterus Adnexa  Cervix  Not visualized (advanced GA >24wks)  Uterus  No abnormality visualized.  Left  Ovary  Within normal limits.  Right Ovary  Within normal limits.  Cul De Sac  No free fluid seen.  Adnexa  No abnormality visualized. ---------------------------------------------------------------------- Impression  Patient was evaluated for abdominal pain.  A limited ultrasound study was performed. Amniotic fluid is  normal and good fetal activity is seen. Placenta appears normal. ---------------------------------------------------------------------- Recommendations  -Recommend detailed ultrasound evaluation as an outpatient if the patient is discharged home.   MDM Pelvic Exam-Wet Prep & GC/CT Labs: UA US-Limited Assessment and Plan  IUP at ~30.0wks by Unsure LMP Cat I FT Abdominal Pain  -Exam findings discussed -Offered and declined pain medications -Labs pending -NST Reactive -Will send for limited US  Follow Up (5:08 PM) IUP at 30wks Bacteriuria   -US returns without significant findings for placental abruption or previa -Dates c/w LMP and EDD 04/24/2018 -UA returns with significant bacteria; Due to history of bacteriuria will send for culture and treat for Keflex 500mg  PO TID-Disp 21, RF 0 -UC Pending -In room to discuss all results and treatment plan -Patient without q/c -Encouraged to establish care at clinic of choice.  [facility] -Will send email to admin staff requesting to call patient and schedule NOB accordingly. -Encouraged to call or return to MAU if symptoms worsen or with the onset of new symptoms. -Discharged to home in stable condition  Cherre RobinsJessica L Glennette Galster MSN, CNM 02/13/2018, 3:57 PM

## 2018-02-13 NOTE — MAU Note (Signed)
Having really bad stomach pains, upper and lower at times. No bleeding or water leaking.  No care yet.

## 2018-02-14 ENCOUNTER — Telehealth: Payer: Self-pay | Admitting: *Deleted

## 2018-02-14 ENCOUNTER — Other Ambulatory Visit: Payer: Self-pay | Admitting: General Practice

## 2018-02-14 LAB — GC/CHLAMYDIA PROBE AMP (~~LOC~~) NOT AT ARMC
CHLAMYDIA, DNA PROBE: NEGATIVE
NEISSERIA GONORRHEA: NEGATIVE

## 2018-02-14 MED ORDER — CEPHALEXIN 500 MG PO CAPS: 500.0000 mg | ORAL_CAPSULE | Freq: Three times a day (TID) | ORAL | 0 refills | Status: AC

## 2018-02-14 NOTE — Telephone Encounter (Signed)
[place] called this am and left message wanting to know if RX was sent to [facility] on [address] in [place]. I called her back to confirm; but she states - " It was, I already got it". She thanked me for calling her back.

## 2018-02-15 LAB — CULTURE, OB URINE

## 2018-02-20 ENCOUNTER — Encounter: Payer: Self-pay | Admitting: Family Medicine

## 2018-02-20 ENCOUNTER — Encounter: Payer: Medicaid Other | Admitting: Family Medicine

## 2018-03-01 ENCOUNTER — Encounter: Payer: Self-pay | Admitting: Obstetrics & Gynecology

## 2018-03-01 ENCOUNTER — Ambulatory Visit (INDEPENDENT_AMBULATORY_CARE_PROVIDER_SITE_OTHER): Payer: Medicaid Other | Admitting: Obstetrics & Gynecology

## 2018-03-01 DIAGNOSIS — Z3A32 32 weeks gestation of pregnancy: Secondary | ICD-10-CM

## 2018-03-01 DIAGNOSIS — O0933 Supervision of pregnancy with insufficient antenatal care, third trimester: Secondary | ICD-10-CM | POA: Diagnosis not present

## 2018-03-01 DIAGNOSIS — O0990 Supervision of high risk pregnancy, unspecified, unspecified trimester: Secondary | ICD-10-CM

## 2018-03-01 DIAGNOSIS — O0930 Supervision of pregnancy with insufficient antenatal care, unspecified trimester: Secondary | ICD-10-CM

## 2018-03-01 DIAGNOSIS — O0993 Supervision of high risk pregnancy, unspecified, third trimester: Secondary | ICD-10-CM | POA: Diagnosis present

## 2018-03-01 NOTE — Progress Notes (Signed)
  Subjective:    Taylor Gonzalez is being seen today for her first obstetrical visit.  This is not a planned pregnancy. She is at 361w2d gestation. Her obstetrical history is significant for obesity. Relationship with FOB: significant other, living together. Patient does intend to breast feed. Pregnancy history fully reviewed.  Patient reports no complaints.  Review of Systems:   Review of Systems  Objective:     BP 104/68   Pulse (!) 102   Wt 206 lb 8 oz (93.7 kg)   LMP 07/18/2017 (Approximate)   BMI 37.77 kg/m  Physical Exam  Exam Breathing, conversing, and ambulating normally Well nourished, well hydrated Black female, no apparent distress Abd- benign, gravid FH- 33 cm   Assessment:    Pregnancy: [id number] Patient Active Problem List   Diagnosis Date Noted  . Late prenatal care 03/01/2018  . Supervision of high risk pregnancy, antepartum 06/23/2016  . Hyperthyroidism in pregnancy, antepartum 06/23/2016       Plan:     Initial labs drawn. Prenatal vitamins. Problem list reviewed and updated. AFP3 discussed: too late. Role of ultrasound in pregnancy discussed; fetal survey: ordered. Amniocentesis discussed: not indicated. Follow up in 2 weeks. She declines flu vaccine, TDAP, and flu vaccine.   Allie BossierMyra C Renia Mikelson 03/01/2018

## 2018-03-02 ENCOUNTER — Ambulatory Visit (HOSPITAL_COMMUNITY): Admission: RE | Admit: 2018-03-02 | Payer: Medicaid Other | Source: Ambulatory Visit

## 2018-03-04 LAB — URINE CULTURE, OB REFLEX

## 2018-03-04 LAB — CULTURE, OB URINE

## 2018-03-08 ENCOUNTER — Other Ambulatory Visit: Payer: Self-pay | Admitting: Obstetrics & Gynecology

## 2018-03-08 MED ORDER — NITROFURANTOIN MONOHYD MACRO 100 MG PO CAPS: 100.0000 mg | ORAL_CAPSULE | Freq: Two times a day (BID) | ORAL | 1 refills | Status: DC

## 2018-03-08 NOTE — Progress Notes (Signed)
macrobid prescribed for Ecoli UTI 

## 2018-03-09 ENCOUNTER — Ambulatory Visit (HOSPITAL_COMMUNITY): Admission: RE | Admit: 2018-03-09 | Payer: Medicaid Other | Source: Ambulatory Visit

## 2018-03-13 LAB — HEMOGLOBINOPATHY EVALUATION
Ferritin: 6 ng/mL — ABNORMAL LOW (ref 15–150)
HGB A2 QUANT: 2.3 % (ref 1.8–3.2)
HGB A: 97.7 % (ref 96.4–98.8)
HGB C: 0 %
HGB F QUANT: 0 % (ref 0.0–2.0)
Hgb S: 0 %
Hgb Solubility: NEGATIVE
Hgb Variant: 0 %

## 2018-03-13 LAB — OBSTETRIC PANEL, INCLUDING HIV
Antibody Screen: NEGATIVE
BASOS ABS: 0 10*3/uL (ref 0.0–0.2)
Basos: 0 %
EOS (ABSOLUTE): 0 10*3/uL (ref 0.0–0.4)
EOS: 0 %
HEP B S AG: NEGATIVE
HIV Screen 4th Generation wRfx: NONREACTIVE
Hematocrit: 30.5 % — ABNORMAL LOW (ref 34.0–46.6)
Hemoglobin: 9.5 g/dL — ABNORMAL LOW (ref 11.1–15.9)
IMMATURE GRANULOCYTES: 1 %
Immature Grans (Abs): 0.1 10*3/uL (ref 0.0–0.1)
LYMPHS ABS: 2.2 10*3/uL (ref 0.7–3.1)
Lymphs: 24 %
MCH: 23.6 pg — ABNORMAL LOW (ref 26.6–33.0)
MCHC: 31.1 g/dL — ABNORMAL LOW (ref 31.5–35.7)
MCV: 76 fL — ABNORMAL LOW (ref 79–97)
MONOCYTES: 5 %
Monocytes Absolute: 0.5 10*3/uL (ref 0.1–0.9)
NEUTROS ABS: 6.2 10*3/uL (ref 1.4–7.0)
NEUTROS PCT: 70 %
PLATELETS: 323 10*3/uL (ref 150–450)
RBC: 4.03 x10E6/uL (ref 3.77–5.28)
RDW: 15 % (ref 11.7–15.4)
RH TYPE: POSITIVE
RPR Ser Ql: NONREACTIVE
RUBELLA: 2.79 {index} (ref >0.99)
WBC: 9 10*3/uL (ref 3.4–10.8)

## 2018-03-13 LAB — INHERITEST(R) CF/SMA PANEL

## 2018-03-13 LAB — TSH: TSH: 0.021 u[IU]/mL — ABNORMAL LOW (ref 0.450–4.500)

## 2018-03-13 LAB — HEMOGLOBIN A1C
ESTIMATED AVERAGE GLUCOSE: 117 mg/dL
Hgb A1c MFr Bld: 5.7 % — ABNORMAL HIGH (ref 4.8–5.6)

## 2018-03-14 ENCOUNTER — Telehealth: Payer: Self-pay | Admitting: Family Medicine

## 2018-03-14 ENCOUNTER — Encounter: Payer: Medicaid Other | Admitting: Obstetrics & Gynecology

## 2018-03-14 ENCOUNTER — Encounter: Payer: Medicaid Other | Admitting: Family Medicine

## 2018-03-14 NOTE — Telephone Encounter (Signed)
Called patient to request an that she come in earlier. No answer left a message

## 2018-03-16 ENCOUNTER — Encounter: Payer: Self-pay | Admitting: Obstetrics & Gynecology

## 2018-03-16 ENCOUNTER — Encounter: Payer: Medicaid Other | Admitting: Obstetrics & Gynecology

## 2018-03-16 ENCOUNTER — Telehealth: Payer: Self-pay | Admitting: Obstetrics & Gynecology

## 2018-03-16 NOTE — Telephone Encounter (Signed)
Attempted to call the patient via both numbers available in Epic. The line that ends in [phone] is a non-working number. The line ending in [phone], the caller answered and stated we have the wrong number. Sending the patient a missed appointment letter.

## 2018-03-17 ENCOUNTER — Other Ambulatory Visit: Payer: Self-pay | Admitting: Obstetrics & Gynecology

## 2018-03-17 MED ORDER — SULFAMETHOXAZOLE-TRIMETHOPRIM 800-160 MG PO TABS: 1.0000 | ORAL_TABLET | Freq: Two times a day (BID) | ORAL | 0 refills | Status: DC

## 2018-03-17 NOTE — Progress Notes (Unsigned)
Bactrim prescribed for UTI

## 2018-03-23 ENCOUNTER — Telehealth: Payer: Self-pay | Admitting: Emergency Medicine

## 2018-03-23 NOTE — Telephone Encounter (Signed)
Pt called the front office and asked to speak with a nurse. Pt stated that she needed her antibiotic prescription for her UTI sent to [facility] [place] instead of the pharmacy that was listed in her chart. Pt also stated that she did not want the sulfamethoxazole-trimethoprim that was prescribed on 2/21. Pt stated "I found out there were bad side effects for my baby if I take it so I'm not taking that one. I want the other one". Pt was referring to prescription for Macrobid on 2/12. Pt was informed that someone would have the medication transferred and she would be contacted for further questions or clarifications. Pt verbalized understanding and had no further questions.   Pt was sent a mychart message regarding pharmacy transfer.

## 2018-03-24 ENCOUNTER — Other Ambulatory Visit: Payer: Self-pay | Admitting: *Deleted

## 2018-03-24 DIAGNOSIS — R8271 Bacteriuria: Secondary | ICD-10-CM

## 2018-03-24 MED ORDER — NITROFURANTOIN MONOHYD MACRO 100 MG PO CAPS: 100.0000 mg | ORAL_CAPSULE | Freq: Two times a day (BID) | ORAL | 1 refills | Status: DC

## 2018-03-24 NOTE — Progress Notes (Signed)
Per pt request, prescription for Macrobid re-sent to a pt's preferred pharmacy.

## 2018-03-27 ENCOUNTER — Encounter: Payer: Medicaid Other | Admitting: Obstetrics & Gynecology

## 2018-04-03 ENCOUNTER — Ambulatory Visit (HOSPITAL_COMMUNITY): Payer: Medicaid Other | Admitting: *Deleted

## 2018-04-03 ENCOUNTER — Other Ambulatory Visit (HOSPITAL_COMMUNITY): Payer: Self-pay | Admitting: *Deleted

## 2018-04-03 ENCOUNTER — Encounter (HOSPITAL_COMMUNITY): Payer: Self-pay

## 2018-04-03 ENCOUNTER — Ambulatory Visit (HOSPITAL_COMMUNITY)
Admission: RE | Admit: 2018-04-03 | Discharge: 2018-04-03 | Disposition: A | Discharge status: Home / Self Care | Payer: Medicaid Other | Source: Ambulatory Visit | Attending: Obstetrics & Gynecology | Admitting: Obstetrics & Gynecology

## 2018-04-03 DIAGNOSIS — O0930 Supervision of pregnancy with insufficient antenatal care, unspecified trimester: Secondary | ICD-10-CM

## 2018-04-03 DIAGNOSIS — O0990 Supervision of high risk pregnancy, unspecified, unspecified trimester: Secondary | ICD-10-CM

## 2018-04-03 DIAGNOSIS — Z362 Encounter for other antenatal screening follow-up: Secondary | ICD-10-CM

## 2018-04-03 DIAGNOSIS — Z3A34 34 weeks gestation of pregnancy: Secondary | ICD-10-CM | POA: Diagnosis not present

## 2018-04-03 DIAGNOSIS — O0933 Supervision of pregnancy with insufficient antenatal care, third trimester: Secondary | ICD-10-CM | POA: Diagnosis not present

## 2018-04-03 DIAGNOSIS — Z363 Encounter for antenatal screening for malformations: Secondary | ICD-10-CM

## 2018-04-18 ENCOUNTER — Encounter: Payer: Self-pay | Admitting: *Deleted

## 2018-04-26 ENCOUNTER — Ambulatory Visit (HOSPITAL_COMMUNITY): Admission: RE | Admit: 2018-04-26 | Payer: Medicaid Other | Source: Ambulatory Visit

## 2018-04-26 ENCOUNTER — Encounter (HOSPITAL_COMMUNITY): Payer: Self-pay

## 2018-04-26 ENCOUNTER — Ambulatory Visit (HOSPITAL_COMMUNITY): Payer: Medicaid Other | Attending: Obstetrics and Gynecology

## 2018-05-02 ENCOUNTER — Encounter: Payer: Self-pay | Admitting: *Deleted

## 2018-05-18 ENCOUNTER — Telehealth: Payer: Self-pay | Admitting: Family Medicine

## 2018-05-18 NOTE — Telephone Encounter (Signed)
Attempted to reach patient through her mother. Taylor Gonzalez was there, and stated she had delivered at [facility], and had her postpartum appointment with them.

## 2018-05-18 NOTE — Telephone Encounter (Signed)
Attempted to reach patient about missed appointments. Left a VM for her to call the office back.

## 2018-07-17 ENCOUNTER — Encounter (HOSPITAL_COMMUNITY): Payer: Self-pay

## 2018-08-01 ENCOUNTER — Encounter: Payer: Self-pay | Admitting: *Deleted

## 2020-06-15 ENCOUNTER — Inpatient Hospital Stay (HOSPITAL_BASED_OUTPATIENT_CLINIC_OR_DEPARTMENT_OTHER)
Admission: EM | Admit: 2020-06-15 | Discharge: 2020-06-15 | Disposition: A | Discharge status: Home / Self Care | Payer: Medicaid Other | Attending: Obstetrics and Gynecology | Admitting: Obstetrics and Gynecology

## 2020-06-15 ENCOUNTER — Encounter (HOSPITAL_BASED_OUTPATIENT_CLINIC_OR_DEPARTMENT_OTHER): Payer: Self-pay | Admitting: *Deleted

## 2020-06-15 ENCOUNTER — Other Ambulatory Visit: Payer: Self-pay

## 2020-06-15 DIAGNOSIS — O212 Late vomiting of pregnancy: Secondary | ICD-10-CM

## 2020-06-15 DIAGNOSIS — B379 Candidiasis, unspecified: Secondary | ICD-10-CM | POA: Diagnosis not present

## 2020-06-15 DIAGNOSIS — O4703 False labor before 37 completed weeks of gestation, third trimester: Secondary | ICD-10-CM | POA: Diagnosis not present

## 2020-06-15 DIAGNOSIS — O219 Vomiting of pregnancy, unspecified: Secondary | ICD-10-CM

## 2020-06-15 DIAGNOSIS — Z3A33 33 weeks gestation of pregnancy: Secondary | ICD-10-CM | POA: Insufficient documentation

## 2020-06-15 DIAGNOSIS — B370 Candidal stomatitis: Secondary | ICD-10-CM | POA: Diagnosis not present

## 2020-06-15 DIAGNOSIS — O98813 Other maternal infectious and parasitic diseases complicating pregnancy, third trimester: Secondary | ICD-10-CM

## 2020-06-15 DIAGNOSIS — O479 False labor, unspecified: Secondary | ICD-10-CM

## 2020-06-15 LAB — URINALYSIS, ROUTINE W REFLEX MICROSCOPIC
Glucose, UA: NEGATIVE mg/dL
Ketones, ur: 80 mg/dL — AB
Nitrite: NEGATIVE
Protein, ur: 100 mg/dL — AB
RBC / HPF: >50 RBC/hpf — ABNORMAL HIGH (ref 0–5)
Specific Gravity, Urine: 1.024 (ref 1.005–1.030)
pH: 5 (ref 5.0–8.0)

## 2020-06-15 LAB — CBC WITH DIFFERENTIAL/PLATELET
Abs Immature Granulocytes: 0.07 10*3/uL (ref 0.00–0.07)
Basophils Absolute: 0 10*3/uL (ref 0.0–0.1)
Basophils Relative: 0 %
Eosinophils Absolute: 0 10*3/uL (ref 0.0–0.5)
Eosinophils Relative: 0 %
HCT: 37.8 % (ref 36.0–46.0)
Hemoglobin: 11.7 g/dL — ABNORMAL LOW (ref 12.0–15.0)
Immature Granulocytes: 1 %
Lymphocytes Relative: 23 %
Lymphs Abs: 2 10*3/uL (ref 0.7–4.0)
MCH: 23.1 pg — ABNORMAL LOW (ref 26.0–34.0)
MCHC: 31 g/dL (ref 30.0–36.0)
MCV: 74.6 fL — ABNORMAL LOW (ref 80.0–100.0)
Monocytes Absolute: 0.7 10*3/uL (ref 0.1–1.0)
Monocytes Relative: 8 %
Neutro Abs: 6 10*3/uL (ref 1.7–7.7)
Neutrophils Relative %: 68 %
Platelets: 380 10*3/uL (ref 150–400)
RBC: 5.07 MIL/uL (ref 3.87–5.11)
RDW: 18.6 % — ABNORMAL HIGH (ref 11.5–15.5)
WBC: 8.7 10*3/uL (ref 4.0–10.5)
nRBC: 0 % (ref 0.0–0.2)

## 2020-06-15 LAB — COMPREHENSIVE METABOLIC PANEL
ALT: 15 U/L (ref 0–44)
AST: 27 U/L (ref 15–41)
Albumin: 3.4 g/dL — ABNORMAL LOW (ref 3.5–5.0)
Alkaline Phosphatase: 267 U/L — ABNORMAL HIGH (ref 38–126)
Anion gap: 13 (ref 5–15)
BUN: 7 mg/dL (ref 6–20)
CO2: 18 mmol/L — ABNORMAL LOW (ref 22–32)
Calcium: 9.8 mg/dL (ref 8.9–10.3)
Chloride: 105 mmol/L (ref 98–111)
Creatinine, Ser: 0.97 mg/dL (ref 0.44–1.00)
GFR, Estimated: >60 mL/min (ref >60)
Glucose, Bld: 95 mg/dL (ref 70–99)
Potassium: 3.7 mmol/L (ref 3.5–5.1)
Sodium: 136 mmol/L (ref 135–145)
Total Bilirubin: 0.6 mg/dL (ref 0.3–1.2)
Total Protein: 8.1 g/dL (ref 6.5–8.1)

## 2020-06-15 LAB — LIPASE, BLOOD: Lipase: 21 U/L (ref 11–51)

## 2020-06-15 MED ORDER — LACTATED RINGERS IV BOLUS
1000.0000 mL | Freq: Once | INTRAVENOUS | Status: AC
  Administered 2020-06-15: 1000 mL via INTRAVENOUS

## 2020-06-15 MED ORDER — ONDANSETRON HCL 4 MG/2ML IJ SOLN
4.0000 mg | Freq: Once | INTRAMUSCULAR | Status: AC
  Administered 2020-06-15: 4 mg via INTRAVENOUS
  Filled 2020-06-15: qty 2

## 2020-06-15 MED ORDER — PROMETHAZINE HCL 25 MG PO TABS: 25.0000 mg | ORAL_TABLET | Freq: Four times a day (QID) | ORAL | 0 refills | Status: AC | PRN

## 2020-06-15 MED ORDER — METOCLOPRAMIDE HCL 5 MG/ML IJ SOLN
10.0000 mg | Freq: Once | INTRAMUSCULAR | Status: AC
  Administered 2020-06-15: 10 mg via INTRAVENOUS
  Filled 2020-06-15: qty 2

## 2020-06-15 MED ORDER — DIPHENHYDRAMINE HCL 50 MG/ML IJ SOLN
25.0000 mg | Freq: Once | INTRAMUSCULAR | Status: AC
  Administered 2020-06-15: 25 mg via INTRAVENOUS
  Filled 2020-06-15: qty 1

## 2020-06-15 MED ORDER — NYSTATIN 100000 UNIT/ML MT SUSP
5.0000 mL | Freq: Once | OROMUCOSAL | Status: AC
  Administered 2020-06-15: 500000 [IU] via OROMUCOSAL
  Filled 2020-06-15 (×2): qty 5

## 2020-06-15 NOTE — Progress Notes (Signed)
Spoke with Dr. Alysia Penna. Pt is a G4P3 at 33 2/[redacted] weeks gestation presenting to [facility] with c/o N&V. Pt gets her care in [place]. Denies vaginal bleeding, leaking of fluid, or uc's. Pt is having uc's with ui. FHR is reactive. They are giving her IVF, doing a U/A, and giving her meds for her nausea. Dr, Alysia Penna says the ED provider can send the pt home if they are comfortable with her having uc's that are probably due to her being dehydrated from N&V. Pt says she has had all term vaginal deliveries. If they want the pt evaluated further, they can call him at [phone] and have the pt transferred to [facility]. [phone]. [facility] RN notified of pt's uc's and that she can be transferred here for further evaluation.

## 2020-06-15 NOTE — ED Notes (Signed)
Spoke with Corrie Dandy a [facility], Dr Alysia Penna is ok for pt to be dc. Pt is having contractions, and is ok to be transferred to [facility] if EDP would like. If to be admit, EDP will need to call [phone]

## 2020-06-15 NOTE — ED Notes (Signed)
Report given to [place] RN with [employer]

## 2020-06-15 NOTE — ED Notes (Signed)
ED Provider at bedside. 

## 2020-06-15 NOTE — MAU Note (Signed)
Taylor Gonzalez is a 25 y.o. at [redacted]w[redacted]d here in MAU reporting: arrived via [facility] from [facility] for contractions. Initially went there with complaints of nausea and vomiting. Is still having some mild nausea. Denies abdominal pain, states she feels tightness but no pain. No bleeding or LOF. +FM  Onset of complaint: today  Pain score: 0/10  Vitals:   06/15/20 1900 06/15/20 2008  BP: 111/69 111/61  Pulse: (!) 103 99  Resp: 18 16  Temp:  99.2 F (37.3 C)  SpO2: 100% 100%     FHT: EFM applied in room  Lab orders placed from triage: UA

## 2020-06-15 NOTE — Discharge Instructions (Signed)
Oral Thrush, Adult Oral thrush, also called oral candidiasis, is a fungal infection that develops in the mouth and throat and on the tongue. It causes white patches to form in the mouth and on the tongue. Many cases of thrush are mild, but this infection can also be serious. Thrush can be a repeated (recurrent) problem for certain people who have a weak body defense system (immune system). The weakness can be caused by chronic illnesses, or by taking medicines that limit the body's ability to fight infection. If a person has difficulty fighting infection, the fungus that causes thrush can spread through the body. This can cause life-threatening blood or organ infections. What are the causes? This condition is caused by a fungus (yeast) called Candida albicans.  This fungus is normally present in small amounts in the mouth and on other mucous membranes. It usually causes no harm.  If conditions are present that allow the fungus to grow without control, it invades surrounding tissues and becomes an infection.  Other Candida species can also lead to thrush, though this is rare. What increases the risk? The following factors may make you more likely to develop this condition:  Having a weakened immune system.  Being an older adult.  Having diabetes, cancer, or HIV (human immunodeficiency virus).  Having dry mouth (xerostomia).  Being pregnant or breastfeeding.  Having poor dental care, especially in those who have dentures.  Using antibiotic or steroid medicines. What are the signs or symptoms? Symptoms of this condition can vary from mild and moderate to severe and persistent. Symptoms may include:  A burning feeling in the mouth and throat. This can occur at the start of a thrush infection.  White patches that stick to the mouth and tongue. The tissue around the patches may be red, raw, and painful. If rubbed (during tooth brushing, for example), the patches and the tissue of the mouth  may bleed easily.  A bad taste in the mouth or difficulty tasting foods.  A cottony feeling in the mouth.  Pain during eating and swallowing.  Poor appetite.  Cracking at the corners of the mouth.   How is this diagnosed? This condition is diagnosed based on:  A physical exam.  Your medical history. How is this treated? This condition is treated with medicines called antifungals, which prevent the growth of fungi. These medicines are either applied directly to the affected area (topical) or swallowed (oral). The treatment will depend on the severity of the condition.  Mild cases of thrush may be treated with an antifungal mouth rinse or lozenges. Treatment usually lasts about 14 days.  Moderate to severe cases of thrush can be treated with oral antifungal medicine, if they have spread to the esophagus. A topical antifungal medicine may also be used. For some severe infections, treatment may need to continue for more than 14 days. ? Oral antifungal medicines are rarely used during pregnancy because they may be harmful to the unborn child. If you are pregnant, talk with your health care provider about options for treatment.  Persistent or recurrent thrush. For cases of thrush that do not go away or keep coming back: ? Treatment may be needed twice as long as the symptoms last. ? Treatment will include both oral and topical antifungal medicines. ? People with a weakened immune system can take an antifungal medicine on a continuous basis to prevent thrush infections. It is important to treat conditions that make a person more likely to get thrush, such as   diabetes or HIV. Follow these instructions at home: Medicines  Take or use over-the-counter and prescription medicines only as told by your health care provider.  Talk with your health care provider about an over-the-counter medicine called gentian violet, which kills bacteria and fungi. Relieving soreness and discomfort To help  reduce the discomfort of thrush:  Drink cold liquids such as water or iced tea.  Try flavored ice treats or frozen juices.  Eat foods that are easy to swallow, such as gelatin, ice cream, or custard.  Try drinking from a straw if the patches in your mouth are painful.   General instructions  Eat plain, unflavored yogurt as directed by your health care provider. Check the label to make sure the yogurt contains live cultures. This yogurt can help healthy bacteria grow in the mouth and can stop the growth of the fungus that causes thrush.  If you wear dentures, remove the dentures before going to bed, brush them vigorously, and soak them in a cleaning solution as directed by your health care provider.  Rinse your mouth with a warm salt-water mixture several times a day. To make a salt-water mixture, dissolve -1 tsp (3-6 g) of salt in 1 cup (237 mL) of warm water. Contact a health care provider if:  Your symptoms are getting worse or are not improving within 7 days of starting treatment.  You have symptoms of a spreading infection, such as white patches on the skin outside of the mouth.  You are breastfeeding your baby and you have redness and pain in the nipples. Summary  Oral thrush, also called oral candidiasis, is a fungal infection that develops in the mouth and throat and on the tongue. It causes white patches to form in the mouth and on the tongue.  You are more likely to get this condition if you have a weakened immune system or an underlying condition, such as HIV, cancer, or diabetes.  This condition is treated with medicines called antifungals, which prevent the growth of fungi.  Contact a health care provider if your symptoms do not improve, or get worse, within 7 days of starting treatment. This information is not intended to replace advice given to you by your health care provider. Make sure you discuss any questions you have with your health care provider. Document  Revised: 11/17/2018 Document Reviewed: 11/17/2018 Elsevier Patient Education  2021 Elsevier Inc.  

## 2020-06-15 NOTE — Progress Notes (Signed)
Spoke with Engineer, production. Pt is having uc's with ui. Pt says she had her other babies at term.

## 2020-06-15 NOTE — Progress Notes (Signed)
Report given to [facility] RN, charge nurse., MAU.

## 2020-06-15 NOTE — Progress Notes (Signed)
Dr. Alysia Penna reviewing FHR tracing. Says he will call [facility] provider to have the pt transferred here for further evaluation.

## 2020-06-15 NOTE — MAU Provider Note (Signed)
History     CSN: [id number]  Arrival date and time: 06/15/20 1459   Event Date/Time   First Provider Initiated Contact with Patient 06/15/20 2038      Chief Complaint  Patient presents with  . Emesis    8 mo preg  . Contractions   HPI [place] Dominque Raedyn Klinck is a 25 y.o. [phone] at [redacted]w[redacted]d who presents as a transfer from [facility] ED for evaluation of contractions. Patient presented there due to nausea & vomiting. Symptoms started a week ago. Does not have antiemetic at home. This is a new symptom during her pregnancy. Was found to be contracting on monitor there so was sent here for preterm labor evaluation. Patient reports feeling occasional abdominal tightening but can't tell frequency & denies associated pain. Denies fever, diarrhea, dysuria, vaginal bleeding, or LOF. Reports good fetal movement. Has gotten prenatal care in [place] but comes to [place] on the weekends to visit her mother.   OB History    Gravida  5   Para  3   Term  3   Preterm  0   AB  1   Living  3     SAB  1   IAB  0   Ectopic  0   Multiple  0   Live Births  3           Past Medical History:  Diagnosis Date  . Hyperthyroidism   . Thyroid disease     Past Surgical History:  Procedure Laterality Date  . NO PAST SURGERIES      Family History  Problem Relation Age of Onset  . Diabetes Paternal Grandmother   . Hypertension Paternal Grandmother   . Diabetes Maternal Grandmother   . Diabetes Maternal Grandfather   . Hypertension Maternal Grandfather   . Epilepsy Mother     Social History   Tobacco Use  . Smoking status: Never Smoker  . Smokeless tobacco: Never Used  Vaping Use  . Vaping Use: Never used  Substance Use Topics  . Alcohol use: No  . Drug use: No    Allergies: No Known Allergies  Medications Prior to Admission  Medication Sig Dispense Refill Last Dose  . nitrofurantoin, macrocrystal-monohydrate, (MACROBID) 100 MG capsule Take 1 capsule (100 mg  total) by mouth 2 (two) times daily. 14 capsule 1   . Prenatal Vit-Fe Fumarate-FA (PRENATAL MULTIVITAMIN) TABS tablet Take 1 tablet by mouth daily at 12 noon.     . sulfamethoxazole-trimethoprim (BACTRIM DS,SEPTRA DS) 800-160 MG tablet Take 1 tablet by mouth 2 (two) times daily. 14 tablet 0     Review of Systems  Constitutional: Negative.   Gastrointestinal: Positive for nausea and vomiting. Negative for abdominal pain.  Genitourinary: Negative.    Physical Exam   Blood pressure 111/61, pulse 99, temperature 99.2 F (37.3 C), temperature source Oral, resp. rate 16, height 5\' 2"  (1.575 m), SpO2 100 %, not currently breastfeeding.  Physical Exam Vitals and nursing note reviewed. Exam conducted with a chaperone present.  Constitutional:      General: She is not in acute distress.    Appearance: Normal appearance. She is normal weight. She is not ill-appearing or diaphoretic.  HENT:     Head: Normocephalic and atraumatic.  Eyes:     General: No scleral icterus. Pulmonary:     Effort: Pulmonary effort is normal. No respiratory distress.  Abdominal:     Palpations: Abdomen is soft.     Tenderness: There is no abdominal tenderness.  Genitourinary:    Comments: Dilation: Closed Effacement (%): Thick Cervical Position: Posterior Exam by:: Lolamae Voisin np  Skin:    General: Skin is warm and dry.  Neurological:     Mental Status: She is alert.  Psychiatric:        Mood and Affect: Mood normal.        Behavior: Behavior normal.    NST:  Baseline: 135 bpm, Variability: Good {> 6 bpm), Accelerations: Reactive and Decelerations: Absent  MAU Course  Procedures Results for orders placed or performed during the hospital encounter of 06/15/20 (from the past 24 hour(s))  Lipase, blood     Status: None   Collection Time: 06/15/20  3:53 PM  Result Value Ref Range   Lipase 21 11 - 51 U/L  Comprehensive metabolic panel     Status: Abnormal   Collection Time: 06/15/20  3:53 PM  Result  Value Ref Range   Sodium 136 135 - 145 mmol/L   Potassium 3.7 3.5 - 5.1 mmol/L   Chloride 105 98 - 111 mmol/L   CO2 18 (L) 22 - 32 mmol/L   Glucose, Bld 95 70 - 99 mg/dL   BUN 7 6 - 20 mg/dL   Creatinine, Ser 0.98 0.44 - 1.00 mg/dL   Calcium 9.8 8.9 - 11.9 mg/dL   Total Protein 8.1 6.5 - 8.1 g/dL   Albumin 3.4 (L) 3.5 - 5.0 g/dL   AST 27 15 - 41 U/L   ALT 15 0 - 44 U/L   Alkaline Phosphatase 267 (H) 38 - 126 U/L   Total Bilirubin 0.6 0.3 - 1.2 mg/dL   GFR, Estimated >14 >78 mL/min   Anion gap 13 5 - 15  CBC with Differential     Status: Abnormal   Collection Time: 06/15/20  3:53 PM  Result Value Ref Range   WBC 8.7 4.0 - 10.5 K/uL   RBC 5.07 3.87 - 5.11 MIL/uL   Hemoglobin 11.7 (L) 12.0 - 15.0 g/dL   HCT 29.5 62.1 - 30.8 %   MCV 74.6 (L) 80.0 - 100.0 fL   MCH 23.1 (L) 26.0 - 34.0 pg   MCHC 31.0 30.0 - 36.0 g/dL   RDW 65.7 (H) 84.6 - 96.2 %   Platelets 380 150 - 400 K/uL   nRBC 0.0 0.0 - 0.2 %   Neutrophils Relative % 68 %   Neutro Abs 6.0 1.7 - 7.7 K/uL   Lymphocytes Relative 23 %   Lymphs Abs 2.0 0.7 - 4.0 K/uL   Monocytes Relative 8 %   Monocytes Absolute 0.7 0.1 - 1.0 K/uL   Eosinophils Relative 0 %   Eosinophils Absolute 0.0 0.0 - 0.5 K/uL   Basophils Relative 0 %   Basophils Absolute 0.0 0.0 - 0.1 K/uL   Immature Granulocytes 1 %   Abs Immature Granulocytes 0.07 0.00 - 0.07 K/uL  Urinalysis, Routine w reflex microscopic Urine, Clean Catch     Status: Abnormal   Collection Time: 06/15/20  8:29 PM  Result Value Ref Range   Color, Urine AMBER (A) YELLOW   APPearance CLOUDY (A) CLEAR   Specific Gravity, Urine 1.024 1.005 - 1.030   pH 5.0 5.0 - 8.0   Glucose, UA NEGATIVE NEGATIVE mg/dL   Hgb urine dipstick SMALL (A) NEGATIVE   Bilirubin Urine SMALL (A) NEGATIVE   Ketones, ur 80 (A) NEGATIVE mg/dL   Protein, ur 952 (A) NEGATIVE mg/dL   Nitrite NEGATIVE NEGATIVE   Leukocytes,Ua MODERATE (A) NEGATIVE  RBC / HPF >50 (H) 0 - 5 RBC/hpf   WBC, UA 21-50 0 - 5 WBC/hpf    Bacteria, UA MANY (A) NONE SEEN   Squamous Epithelial / LPF 11-20 0 - 5   Mucus PRESENT    Amorphous Crystal PRESENT    Non Squamous Epithelial 0-5 (A) NONE SEEN    MDM Reactive fetal tracing at [facility] & in MAU. Some contractions on monitor but patient does not appear uncomfortable, does not feel them, & her cervix is closed/thick.   Given [facility] fluids at [facility] - u/a in MAU has 80 of ketones & high gravity - given additional liter of LR in MAU in addition to IV zofran due to nausea.  Patient reports improvement in symptoms.   Was originally seen at [facility] for thrush - is requesting treatment as she says that it is preventing her from eating & drinking. First dose of nystatin given in MAU - will prescribe rest of treatment.   Patient unclear regarding prenatal care thus far in pregnancy. [facility] as she has been there with previous pregnancies. Will send message to office to coordinate.   Assessment and Plan   1. Braxton Hick's contraction  -reviewed PTL precautions & reasons to return to MAU  2. Nausea and vomiting during pregnancy  -rx phenergan  3. Thrush, oral  -rx nystatin swish  4. [redacted] weeks gestation of pregnancy  -message to office for new ob appointment      Judeth Horn 06/15/2020, 10:42 PM

## 2020-06-15 NOTE — ED Notes (Signed)
Spoke with Corrie Dandy at [facility], will monitor remotely

## 2020-06-15 NOTE — ED Provider Notes (Signed)
[facility] EMERGENCY DEPARTMENT Provider Note   CSN: [id number] Arrival date & time: 06/15/20  1459     History Chief Complaint  Patient presents with  . Emesis    8 mo preg    Taylor Gonzalez is a 25 y.o. female.  25 yo F with a chief complaints of nausea and vomiting.  Patient states been going on for the past week.  She is not wanting to eat or drink because her tongue hurts.  Tells me that she thinks she has thrush.  It is noted that her tongue has changed color somewhat.  She denies history of diabetes denies history of HIV.  She has had no complications this pregnancy that she knows of.  Denies vaginal bleeding or discharge.  Has had some mild lower diffuse abdominal pain with this nausea and vomiting.  The history is provided by the patient.  Emesis Severity:  Moderate Duration:  1 week Timing:  Constant Progression:  Worsening Chronicity:  New Relieved by:  Nothing Worsened by:  Nothing Ineffective treatments:  None tried Associated symptoms: no arthralgias, no chills, no fever, no headaches and no myalgias        Past Medical History:  Diagnosis Date  . Hyperthyroidism   . Hyperthyroidism    no meds now  . Infection    UTI  . Thyroid disease     Patient Active Problem List   Diagnosis Date Noted  . Late prenatal care 03/01/2018  . Supervision of high risk pregnancy, antepartum 06/23/2016  . Hyperthyroidism in pregnancy, antepartum 06/23/2016    Past Surgical History:  Procedure Laterality Date  . NO PAST SURGERIES       OB History    Gravida  4   Para  2   Term  2   Preterm  0   AB  0   Living  2     SAB  0   IAB  0   Ectopic  0   Multiple  0   Live Births  2           Family History  Problem Relation Age of Onset  . Diabetes Paternal Grandmother   . Hypertension Paternal Grandmother   . Diabetes Maternal Grandmother   . Diabetes Maternal Grandfather   . Hypertension Maternal Grandfather    . Epilepsy Mother     Social History   Tobacco Use  . Smoking status: Never Smoker  . Smokeless tobacco: Never Used  Vaping Use  . Vaping Use: Never used  Substance Use Topics  . Alcohol use: No  . Drug use: No    Home Medications Prior to Admission medications   Medication Sig Start Date End Date Taking? Authorizing Provider  nitrofurantoin, macrocrystal-monohydrate, (MACROBID) 100 MG capsule Take 1 capsule (100 mg total) by mouth 2 (two) times daily. 03/24/18   Allie Bossier, MD  Prenatal Vit-Fe Fumarate-FA (PRENATAL MULTIVITAMIN) TABS tablet Take 1 tablet by mouth daily at 12 noon.    [provider]  sulfamethoxazole-trimethoprim (BACTRIM DS,SEPTRA DS) 800-160 MG tablet Take 1 tablet by mouth 2 (two) times daily. 03/17/18   Allie Bossier, MD    Allergies    Patient has no known allergies.  Review of Systems   Review of Systems  Constitutional: Negative for chills and fever.  HENT: Negative for congestion and rhinorrhea.        Tongue pain and rash  Eyes: Negative for redness and visual disturbance.  Respiratory:  Negative for shortness of breath and wheezing.   Cardiovascular: Negative for chest pain and palpitations.  Gastrointestinal: Positive for nausea and vomiting.  Genitourinary: Negative for dysuria and urgency.  Musculoskeletal: Negative for arthralgias and myalgias.  Skin: Negative for pallor and wound.  Neurological: Negative for dizziness and headaches.    Physical Exam Updated Vital Signs BP 106/61   Pulse (!) 114   Temp 98.6 F (37 C) (Oral)   Resp 20   Ht 5\' 2"  (1.575 m)   SpO2 100%   Breastfeeding No   BMI 38.04 kg/m   Physical Exam Vitals and nursing note reviewed.  Constitutional:      General: She is not in acute distress.    Appearance: She is well-developed. She is not diaphoretic.  HENT:     Head: Normocephalic and atraumatic.     Mouth/Throat:   Eyes:     Pupils: Pupils are equal, round, and reactive to light.   Cardiovascular:     Rate and Rhythm: Normal rate and regular rhythm.     Heart sounds: No murmur heard. No friction rub. No gallop.   Pulmonary:     Effort: Pulmonary effort is normal.     Breath sounds: No wheezing or rales.  Abdominal:     General: There is no distension.     Palpations: Abdomen is soft.     Tenderness: There is no abdominal tenderness.  Musculoskeletal:        General: No tenderness.     Cervical back: Normal range of motion and neck supple.  Skin:    General: Skin is warm and dry.  Neurological:     Mental Status: She is alert and oriented to person, place, and time.  Psychiatric:        Behavior: Behavior normal.     ED Results / Procedures / Treatments   Labs (all labs ordered are listed, but only abnormal results are displayed) Labs Reviewed  COMPREHENSIVE METABOLIC PANEL - Abnormal; Notable for the following components:      Result Value   CO2 18 (*)    Albumin 3.4 (*)    Alkaline Phosphatase 267 (*)    All other components within normal limits  CBC WITH DIFFERENTIAL/PLATELET - Abnormal; Notable for the following components:   Hemoglobin 11.7 (*)    MCV 74.6 (*)    MCH 23.1 (*)    RDW 18.6 (*)    All other components within normal limits  LIPASE, BLOOD  URINALYSIS, ROUTINE W REFLEX MICROSCOPIC    EKG None  Radiology No results found.  Procedures Procedures   Medications Ordered in ED Medications  nystatin (MYCOSTATIN) 100000 UNIT/ML suspension 500,000 Units (500,000 Units Mouth/Throat Not Given 06/15/20 1706)  lactated ringers bolus 1,000 mL ( Intravenous Stopped 06/15/20 1710)  metoCLOPramide (REGLAN) injection 10 mg (10 mg Intravenous Given 06/15/20 1642)  diphenhydrAMINE (BENADRYL) injection 25 mg (25 mg Intravenous Given 06/15/20 1641)  lactated ringers bolus 1,000 mL (1,000 mLs Intravenous New Bag/Given 06/15/20 1752)    ED Course  I have reviewed the triage vital signs and the nursing notes.  Pertinent labs & imaging results  that were available during my care of the patient were reviewed by me and considered in my medical decision making (see chart for details).    MDM Rules/Calculators/A&P                          25 yo F with a chief complaints  of tongue pain and swelling and nausea and vomiting.  Going on for about a week now.  Heart rate into the 140s on arrival.  We will give a bolus of IV fluids.  Blood work.  Blood pressure is without expected for third trimester of pregnancy.  Having some lower abdominal cramping with her vomiting though no significant pain on exam.  Will attempt to place the baby on the monitor as per protocol.  Treat her nausea.  Her tongue rash I think is less likely to be thrush, I will give her a oral dose of nystatin here to swish and spit.  Patient persistently tachycardic though did have improvement of her tachycardia with a bolus of IV fluids.  We will give a second bolus.  Patient having some irregular contractions on her toco monitor.  I was called by the obstetrician, Dr. Alysia Penna, recommended transfer to the MAU.   The patients results and plan were reviewed and discussed.   Any x-rays performed were independently reviewed by myself.   Differential diagnosis were considered with the presenting HPI.  Medications  nystatin (MYCOSTATIN) 100000 UNIT/ML suspension 500,000 Units (500,000 Units Mouth/Throat Not Given 06/15/20 1706)  lactated ringers bolus 1,000 mL ( Intravenous Stopped 06/15/20 1710)  metoCLOPramide (REGLAN) injection 10 mg (10 mg Intravenous Given 06/15/20 1642)  diphenhydrAMINE (BENADRYL) injection 25 mg (25 mg Intravenous Given 06/15/20 1641)  lactated ringers bolus 1,000 mL (1,000 mLs Intravenous New Bag/Given 06/15/20 1752)    Vitals:   06/15/20 1711 06/15/20 1730 06/15/20 1800 06/15/20 1830  BP: 124/81 104/69 110/79 106/61  Pulse: (!) 112 (!) 110 (!) 114 (!) 114  Resp: 18 18 20 20   Temp:      TempSrc:      SpO2: 100% 100% 100% 100%  Height:        Final  diagnoses:  Irregular uterine contractions  Nausea and vomiting during pregnancy    Admission/ observation were discussed with the admitting physician, patient and/or family and they are comfortable with the plan.  Final Clinical Impression(s) / ED Diagnoses Final diagnoses:  Irregular uterine contractions  Nausea and vomiting during pregnancy    Rx / DC Orders ED Discharge Orders    None       , DO 06/15/20 [phone]

## 2020-06-15 NOTE — Progress Notes (Signed)
Received call from [facility] RN, Joretta Bachelor. Pt is a G4P3 at 33 2/[redacted] weeks gestation presenting with c/o N&V. Pt gets her care in [place]. Says she has had previous vaginal deliveries and denies problems with any of her pregnancies. Pt denies vaginal bleeding, leaking of fluid, and uc's.

## 2020-06-15 NOTE — ED Notes (Signed)
Spoke with Corrie Dandy RN with [facility], states will give report to charge nurse for triage at [facility]

## 2020-06-15 NOTE — ED Notes (Addendum)
Pt aware urine specimen ordered. Pt reports inability to provide specimen at this time. Specimen collection device provided to patient. Pt will call out when able to provide specimen

## 2020-06-15 NOTE — ED Triage Notes (Signed)
Pt reports rash on tongue for over a week. States she has not been able to eat due to this and has been vomiting. G4 P3, states she 8 mo preg. Thick yellow/white coating noted on tongue. HR 140s upon arrival to triage room. Reports feeling baby move today

## 2020-06-18 LAB — CULTURE, OB URINE: Culture: >=100000 — AB

## 2020-06-19 ENCOUNTER — Telehealth: Payer: Self-pay | Admitting: Medical

## 2020-06-19 DIAGNOSIS — O2343 Unspecified infection of urinary tract in pregnancy, third trimester: Secondary | ICD-10-CM

## 2020-06-19 DIAGNOSIS — B370 Candidal stomatitis: Secondary | ICD-10-CM

## 2020-06-19 MED ORDER — CEPHALEXIN 500 MG PO CAPS: 500.0000 mg | ORAL_CAPSULE | Freq: Four times a day (QID) | ORAL | 0 refills | Status: AC

## 2020-06-19 MED ORDER — NYSTATIN 100000 UNIT/ML MT SUSP: 5.0000 mL | Freq: Four times a day (QID) | OROMUCOSAL | 0 refills | Status: AC

## 2020-06-19 NOTE — Telephone Encounter (Signed)
LM for patient to return call to MAU for results. +UTI. Rx for Keflex sent to pharmacy on file.   Vonzella Nipple, PA-C 06/19/2020 9:58 AM

## 2020-06-19 NOTE — Telephone Encounter (Signed)
Patient returned call. Advised of UTI and need for Rx. Patient confirmed understanding. Advised to increase PO hydration and discussed warning signs for pyelonephritis. Patient also requested refill of Nystatin mouthwash. Patient voiced understanding and had no further questions.   Vonzella Nipple, PA-C 06/19/2020 10:30 AM

## 2020-07-23 ENCOUNTER — Encounter: Payer: Medicaid Other | Admitting: Family Medicine

## 2021-01-09 IMAGING — US US MFM OB LIMITED
1 series · 14 of 28 positions shown · non-contrast
Comparison: none

[Series 1: us mfm ob limited · 14 of 30 slices shown]
[im 2/30]
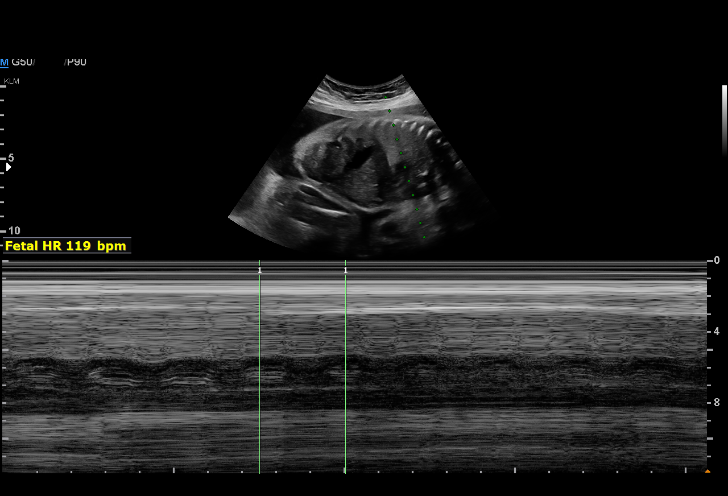
[im 4/30]
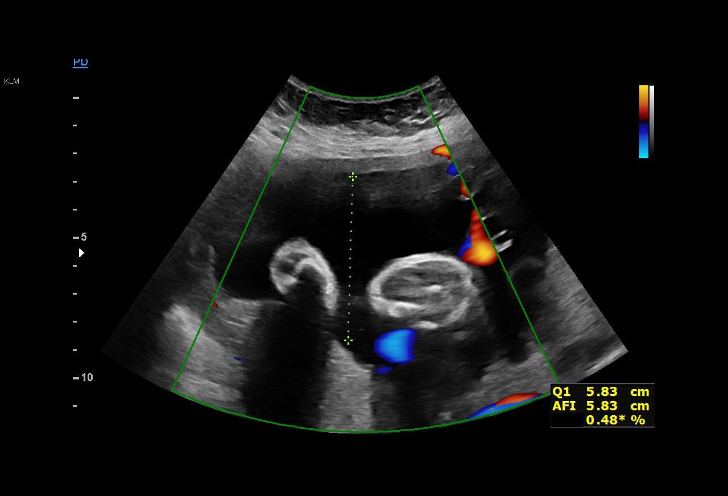
[im 6/30]
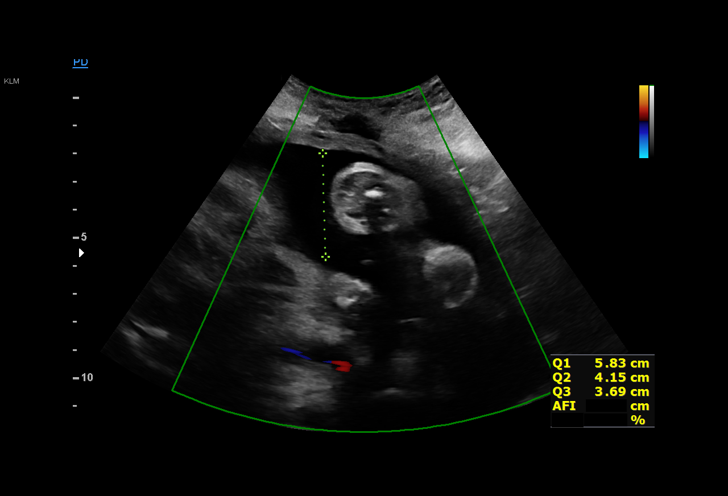
[im 8/30]
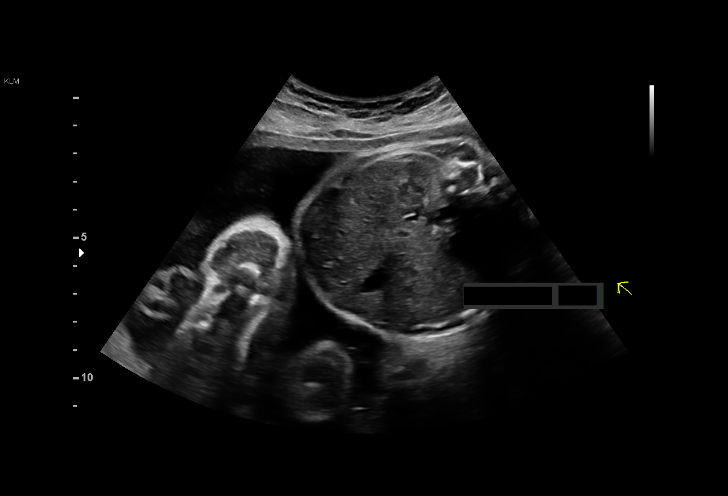
[im 10/30]
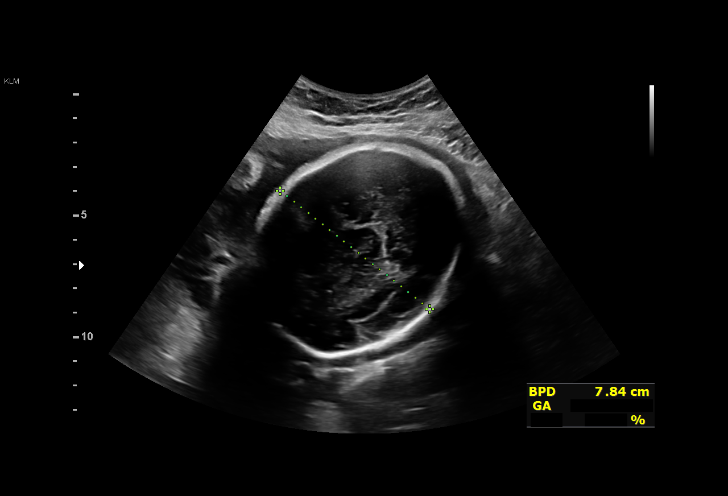
[im 12/30]
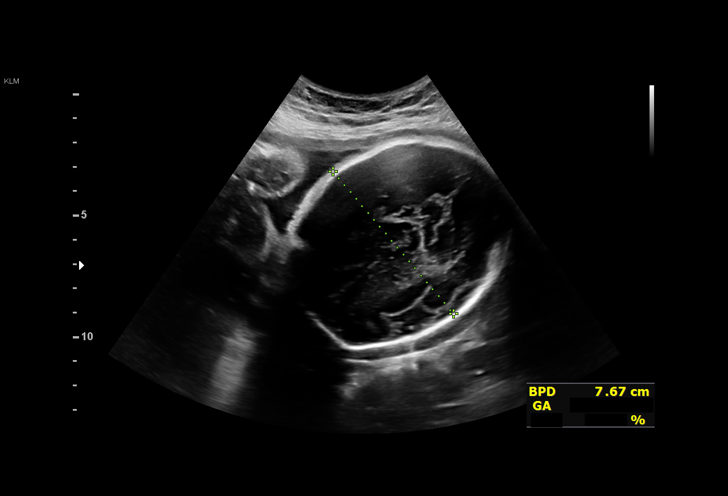
[im 14/30]
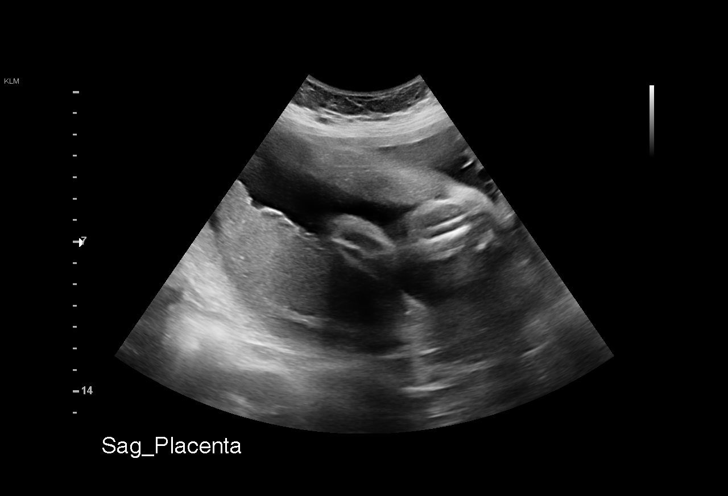
[im 17/30]
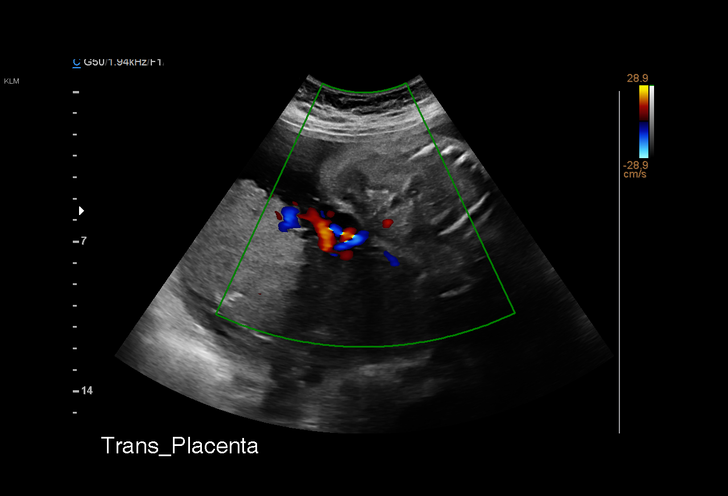
[im 19/30]
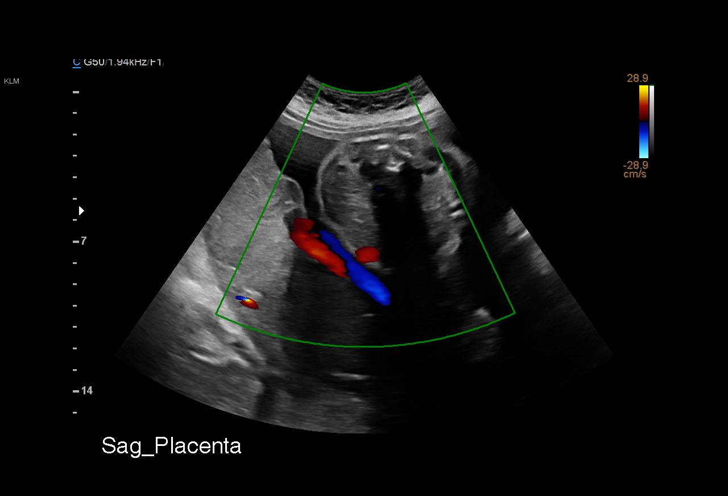
[im 21/30]
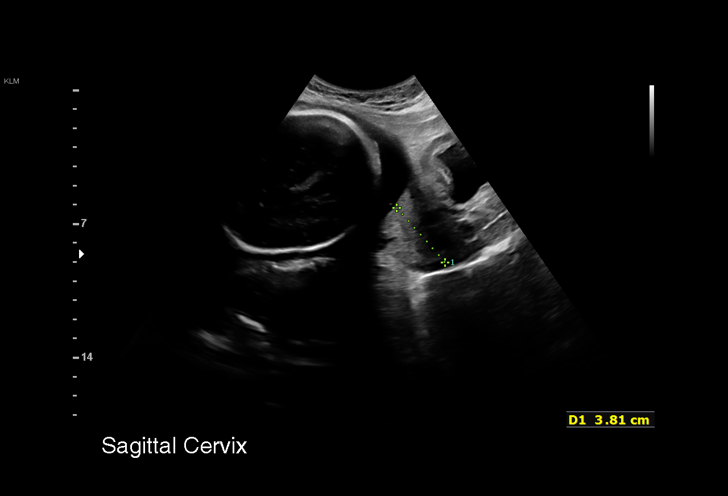
[im 23/30]
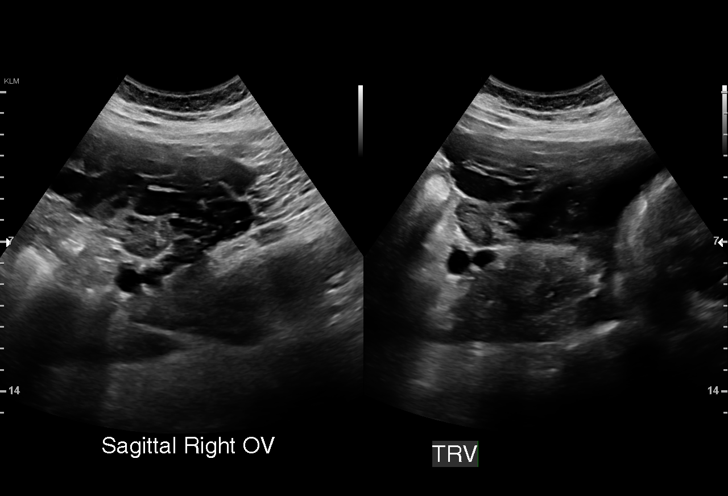
[im 25/30]
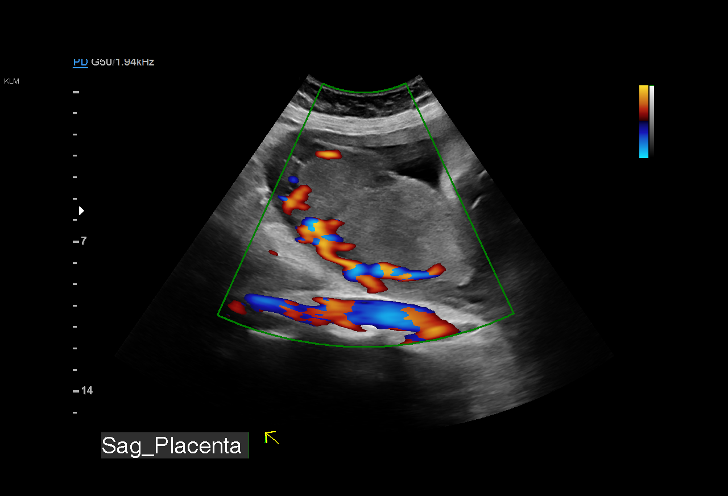
[im 27/30]
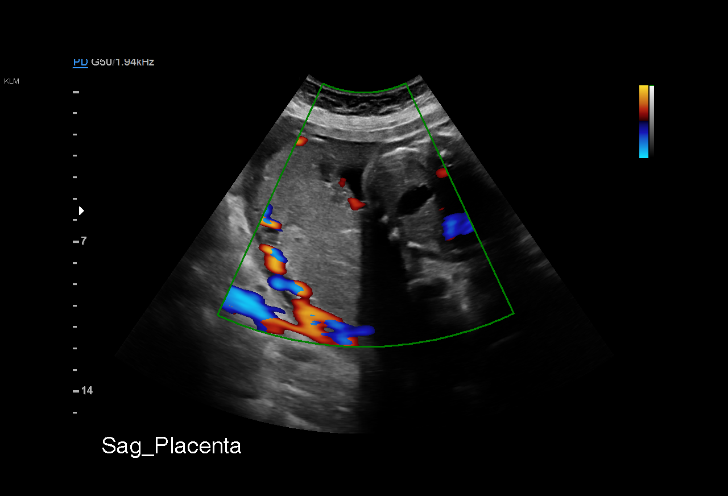
[im 30/30]
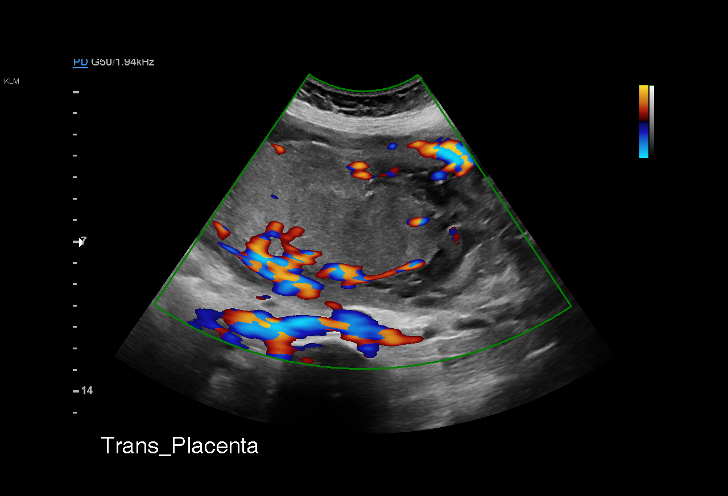

[14 of 28 positions shown; findings below may reference images not displayed]

Attending:        Akeeno Dale        Secondary Phy.:   [facility]-
                                                            MAU/Triage
                   CNM

  1  US MFM OB LIMITED                    76815.01     KAYY TURGOOSE
 ----------------------------------------------------------------------

 ----------------------------------------------------------------------
Indications

  Late to prenatal care, third trimester
  Weeks of gestation of pregnancy not
  specified
  Encounter for uncertain dates
  Pelvic pain affecting pregnancy in third
  trimester
 ----------------------------------------------------------------------
Fetal Evaluation

 Num Of Fetuses:         1
 Fetal Heart Rate(bpm):  134
 Cardiac Activity:       Observed
 Presentation:           Cephalic
 Placenta:               Posterior
 P. Cord Insertion:      Visualized

 Amniotic Fluid
 AFI FV:      Within normal limits

 AFI Sum(cm)                 Largest Pocket(cm)


 RUQ(cm)       RLQ(cm)       LUQ(cm)        LLQ(cm)


 Comment:    No placental abruption or previa identified.
Biometry

 BPD:      76.7  mm     G. Age:  30w 5d
OB History

 Gravidity:    3         Term:   2        Prem:   0        SAB:   0
 TOP:          0       Ectopic:  0        Living: 2
Gestational Age

 LMP:           30w 0d        Date:  07/18/17                 EDD:   04/24/18
 U/S Today:     30w 5d                                        EDD:   04/19/18
Anatomy

 Stomach:               Appears normal, left   Bladder:                Appears normal
                        sided
Cervix Uterus Adnexa

 Cervix
 Not visualized (advanced GA >12wks)

 Uterus
 No abnormality visualized.

 Left Ovary
 Within normal limits.

 Right Ovary
 Within normal limits.

 Cul De Sac
 No free fluid seen.

 Adnexa
 No abnormality visualized.
Impression

 Patient was evaluated for abdominal pain.
 A limited ultrasound study was performed. Amniotic fluid is
 normal and good fetal activity is seen. Placenta appears
 normal.
Recommendations

 -Recommend detailed ultrasound evaluation as an outpatient
 if the patient is discharged home.
                 Aujla, Lesya

## 2021-02-27 IMAGING — US US MFM OB DETAIL +14 WK
1 series · 13 of 28 positions shown · non-contrast
Comparison: none

[Series 1: us mfm ob detail +14 wk · 13 of 57 slices shown]
[im 3/57]
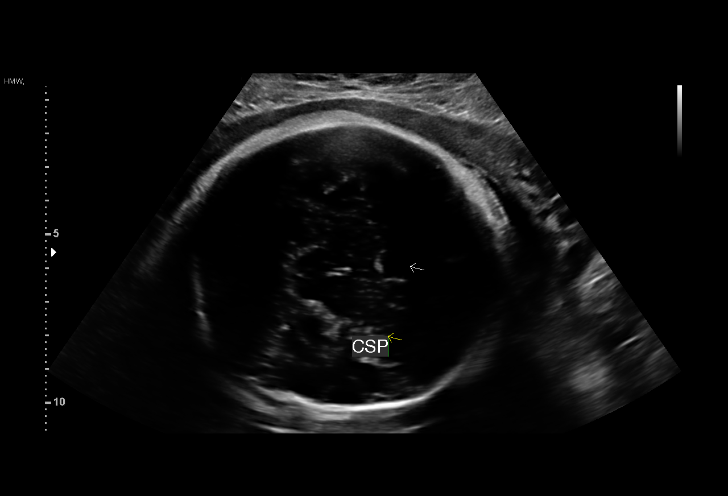
[im 7/57]
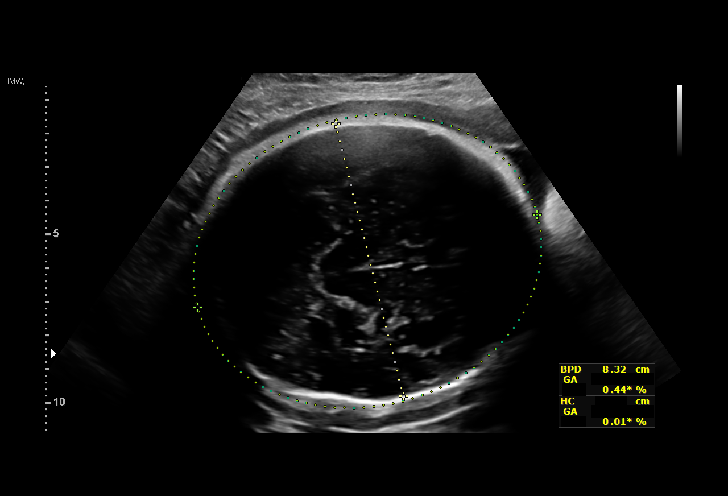
[im 11/57]
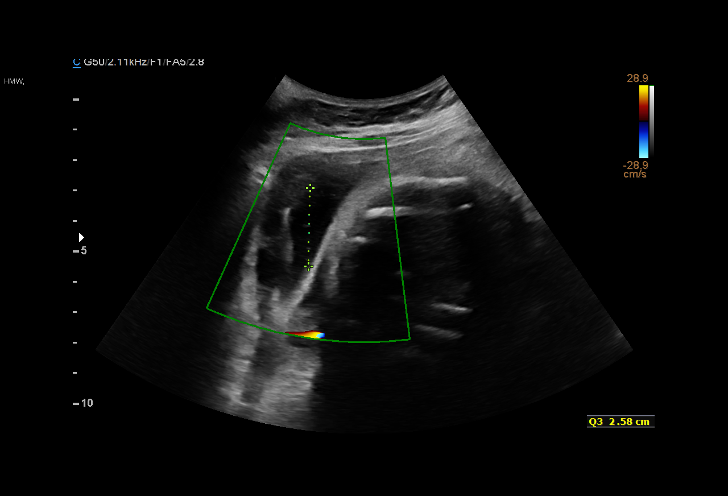
[im 15/57]
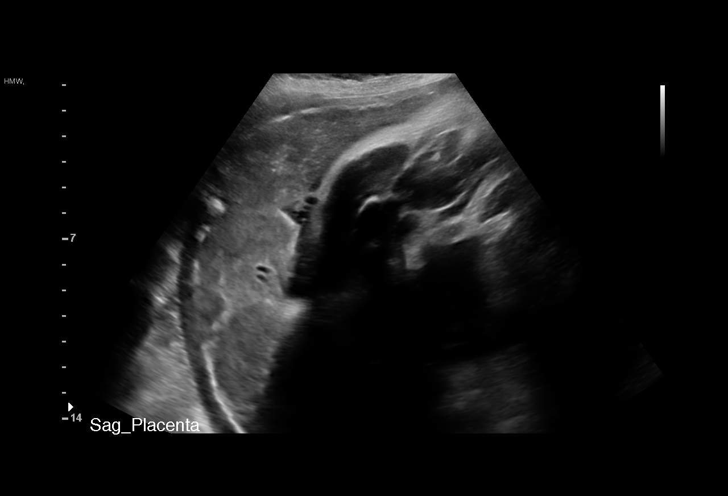
[im 19/57]
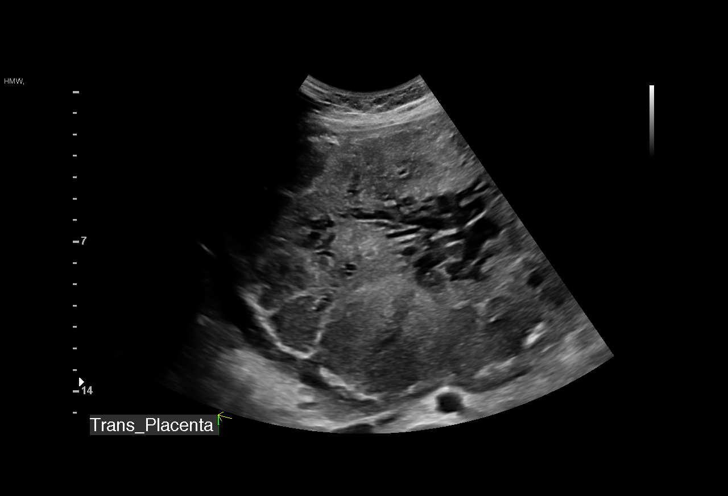
[im 23/57]
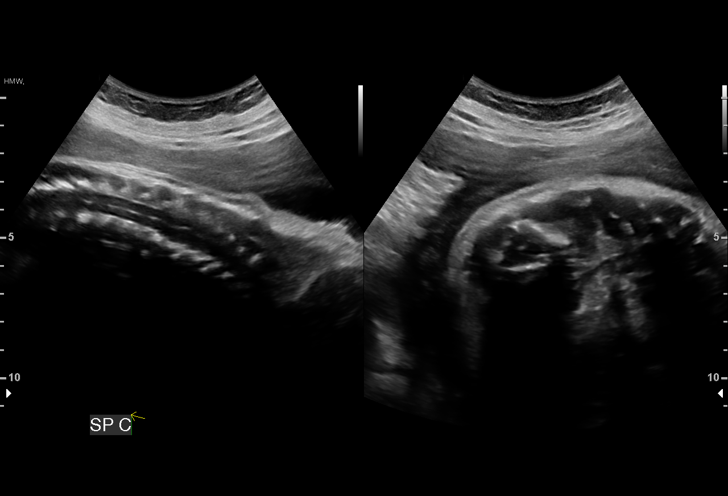
[im 30/57]
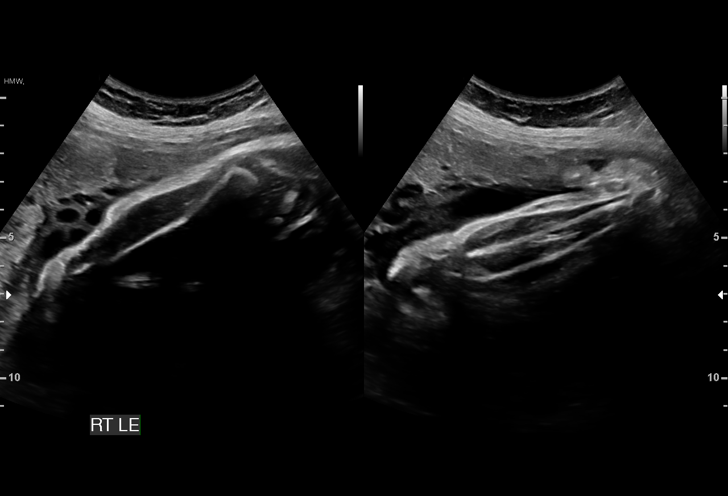
[im 34/57]
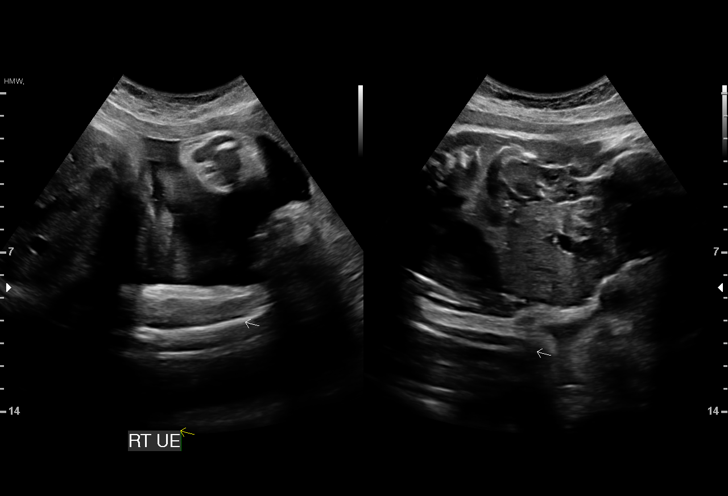
[im 38/57]
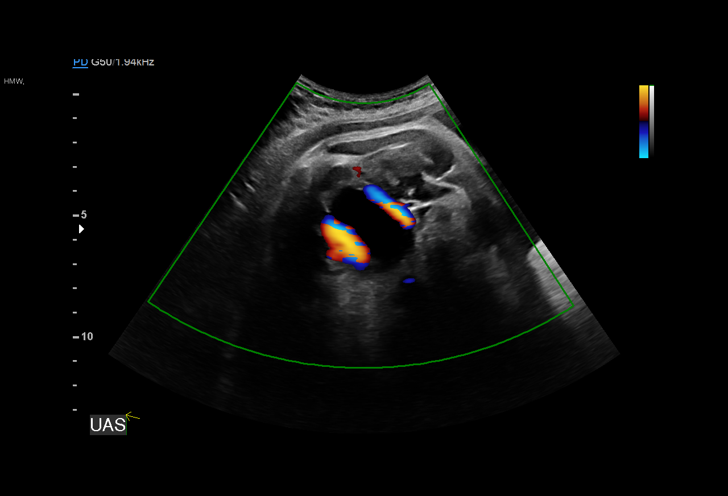
[im 42/57]
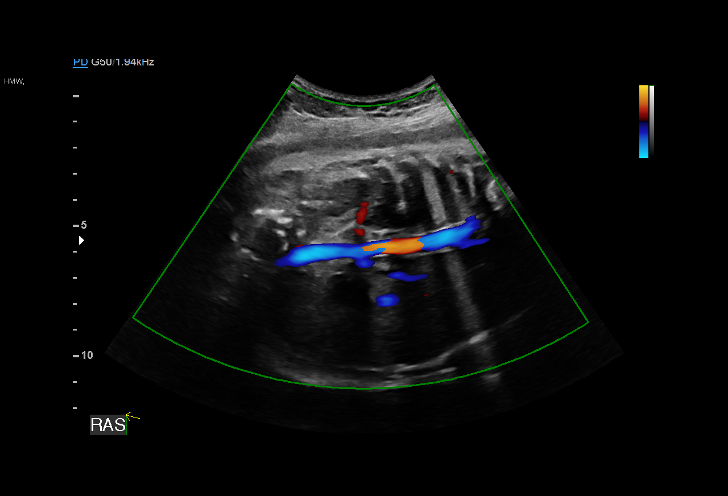
[im 46/57]
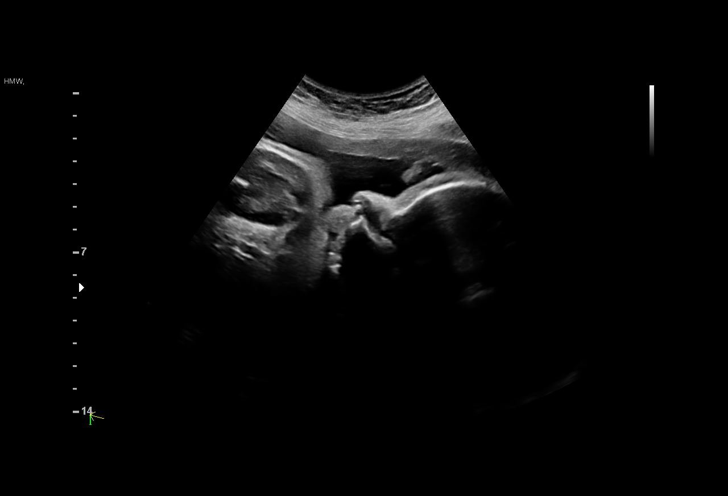
[im 50/57]
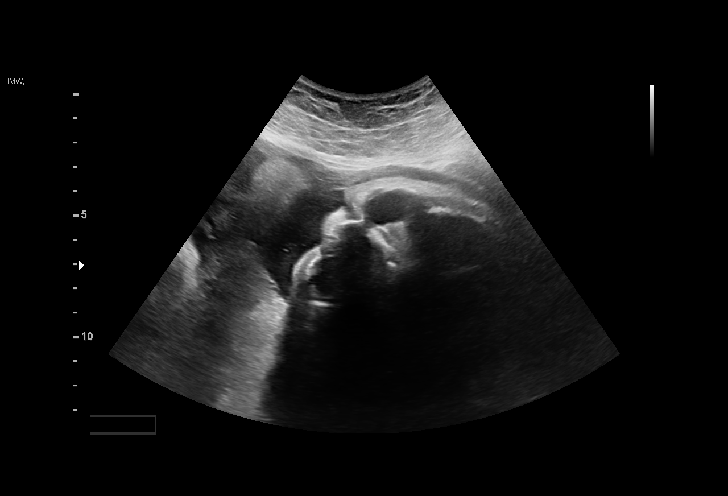
[im 54/57]
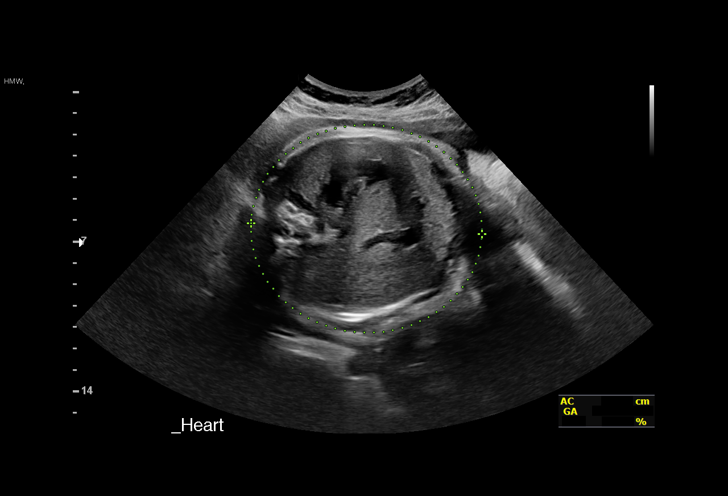

[13 of 28 positions shown; findings below may reference images not displayed]

OB/Gyn Clinic

 ----------------------------------------------------------------------

 ----------------------------------------------------------------------
Indications

  34 weeks gestation of pregnancy
  Encounter for antenatal screening for
  malformations (no testing in chart)
  Late to prenatal care, third trimester
  (received after 28 weeks)
 ----------------------------------------------------------------------
Fetal Evaluation

 Num Of Fetuses:         1
 Fetal Heart Rate(bpm):  131
 Cardiac Activity:       Observed
 Presentation:           Cephalic
 Placenta:               Posterior
 P. Cord Insertion:      Visualized, central

 Amniotic Fluid
 AFI FV:      Within normal limits

 AFI Sum(cm)     %Tile       Largest Pocket(cm)
 14.63           52

 RUQ(cm)       RLQ(cm)       LUQ(cm)        LLQ(cm)

Biometry

 BPD:      82.9  mm     G. Age:  33w 3d         20  %    CI:        76.36   %    70 - 86
                                                         FL/HC:      22.3   %    19.4 -
 HC:      300.6  mm     G. Age:  33w 3d          4  %    HC/AC:      0.93        0.96 -
 AC:      321.8  mm     G. Age:  36w 1d         91  %    FL/BPD:     80.7   %    71 - 87
 FL:       66.9  mm     G. Age:  34w 3d         41  %    FL/AC:      20.8   %    20 - 24
 HUM:      63.6  mm     G. Age:  36w 6d       > 95  %

 Est. FW:    4134  gm    5 lb 11 oz      73  %
OB History

 Gravidity:    3         Term:   2        Prem:   0        SAB:   0
 TOP:          0       Ectopic:  0        Living: 2
Gestational Age

 LMP:           37w 0d        Date:  07/18/17                 EDD:   04/24/18
 U/S Today:     34w 3d                                        EDD:   05/12/18
 Best:          34w 3d     Det. By:  U/S (04/03/18)           EDD:   05/12/18
Anatomy

 Cranium:               Appears normal         Aortic Arch:            Not well visualized
 Cavum:                 Appears normal         Ductal Arch:            Not well visualized
 Ventricles:            Not well visualized    Diaphragm:              Appears normal
 Choroid Plexus:        Appears normal         Stomach:                Appears normal, left
                                                                       sided
 Cerebellum:            Not well visualized    Abdomen:                Appears normal
 Posterior Fossa:       Not well visualized    Abdominal Wall:         Not well visualized
 Nuchal Fold:           Not applicable (>20    Cord Vessels:           Appears normal (3
                        wks GA)                                        vessel cord)
 Face:                  Not well visualized    Kidneys:                Appear normal
 Lips:                  Not well visualized    Bladder:                Appears normal
 Thoracic:              Appears normal         Spine:                  Appears normal
 Heart:                 Not well visualized    Upper Extremities:      Visualized
 RVOT:                  Not well visualized    Lower Extremities:      Visualized
 LVOT:                  Not well visualized

 Other:  Technically difficult due to advanced GA and fetal position.
Cervix Uterus Adnexa

 Cervix
 Not visualized (advanced GA >21wks)

 Uterus
 No abnormality visualized.

 Left Ovary
 No adnexal mass visualized.

 Right Ovary
 No adnexal mass visualized.

 Cul De Sac
 No free fluid seen.

 Adnexa
 No abnormality visualized.
Impression

 Ms. Natassa, Anesths3 P2, initiated her prenatal care late in pregnancy.
 On ultrasound, fetal biometry is consistent with 34w 3d
 gestation. Amnoitic fluid is normal and good fetal activity is
 seen. Fetal anatomy appears normal, but limited by
 advanced gestational age. Umbilical artery Doppler was
 performed because of suboptimal dating to rule out fetal
 growth restriction.
 We have assigned her EDD at 05/12/2018. I informed her
 that late pregnancy dating is suboptimal.
Recommendations

 -An appointment was made for her to return in 3 weeks for
 fetal growth assessment.
                 Eberlinc, Kim Tilen
